# Patient Record
Sex: Male | Born: 1945 | Race: Black or African American | Hispanic: No | Marital: Married | Smoking: Never smoker
Health system: Southern US, Community
[De-identification: ages and names within clinical notes are randomized; demographics above are authoritative.]

## PROBLEM LIST (undated history)

## (undated) DIAGNOSIS — Z8249 Family history of ischemic heart disease and other diseases of the circulatory system: Secondary | ICD-10-CM

## (undated) DIAGNOSIS — C61 Malignant neoplasm of prostate: Secondary | ICD-10-CM

## (undated) DIAGNOSIS — R7303 Prediabetes: Secondary | ICD-10-CM

## (undated) DIAGNOSIS — R42 Dizziness and giddiness: Secondary | ICD-10-CM

## (undated) HISTORY — DX: Malignant neoplasm of prostate: C61

## (undated) HISTORY — PX: PROSTATE SURGERY: SHX751

## (undated) HISTORY — DX: Prediabetes: R73.03

## (undated) HISTORY — DX: Family history of ischemic heart disease and other diseases of the circulatory system: Z82.49

## (undated) HISTORY — DX: Dizziness and giddiness: R42

---

## 2001-08-23 ENCOUNTER — Ambulatory Visit (HOSPITAL_COMMUNITY): Admission: RE | Admit: 2001-08-23 | Discharge: 2001-08-23 | Payer: Self-pay | Admitting: Pediatrics

## 2001-08-23 ENCOUNTER — Encounter: Payer: Self-pay | Admitting: Pediatrics

## 2001-11-26 ENCOUNTER — Ambulatory Visit (HOSPITAL_COMMUNITY): Admission: RE | Admit: 2001-11-26 | Discharge: 2001-11-26 | Payer: Self-pay | Admitting: Internal Medicine

## 2007-06-28 ENCOUNTER — Ambulatory Visit: Payer: Self-pay | Admitting: Orthopedic Surgery

## 2008-12-21 ENCOUNTER — Ambulatory Visit: Payer: Self-pay | Admitting: Internal Medicine

## 2009-01-01 ENCOUNTER — Ambulatory Visit: Payer: Self-pay

## 2009-01-01 ENCOUNTER — Encounter: Payer: Self-pay | Admitting: Internal Medicine

## 2009-01-16 DIAGNOSIS — R0602 Shortness of breath: Secondary | ICD-10-CM | POA: Insufficient documentation

## 2009-01-22 ENCOUNTER — Encounter: Payer: Self-pay | Admitting: Internal Medicine

## 2009-01-22 ENCOUNTER — Ambulatory Visit: Payer: Self-pay | Admitting: Internal Medicine

## 2009-04-07 ENCOUNTER — Emergency Department (HOSPITAL_COMMUNITY): Admission: EM | Admit: 2009-04-07 | Discharge: 2009-04-07 | Payer: Self-pay | Admitting: Emergency Medicine

## 2009-10-02 ENCOUNTER — Ambulatory Visit: Payer: Self-pay | Admitting: Orthopedic Surgery

## 2009-10-02 DIAGNOSIS — IMO0002 Reserved for concepts with insufficient information to code with codable children: Secondary | ICD-10-CM | POA: Insufficient documentation

## 2009-10-02 DIAGNOSIS — M1710 Unilateral primary osteoarthritis, unspecified knee: Secondary | ICD-10-CM

## 2009-10-04 ENCOUNTER — Encounter: Payer: Self-pay | Admitting: Orthopedic Surgery

## 2009-11-24 DIAGNOSIS — C61 Malignant neoplasm of prostate: Secondary | ICD-10-CM

## 2009-11-24 HISTORY — DX: Malignant neoplasm of prostate: C61

## 2010-05-01 ENCOUNTER — Ambulatory Visit (HOSPITAL_COMMUNITY): Admission: RE | Admit: 2010-05-01 | Discharge: 2010-05-01 | Payer: Self-pay

## 2010-08-06 ENCOUNTER — Telehealth (INDEPENDENT_AMBULATORY_CARE_PROVIDER_SITE_OTHER): Payer: Self-pay | Admitting: *Deleted

## 2010-12-26 NOTE — Progress Notes (Signed)
  Phone Note From Other Clinic   Caller: An[facility] Initial call taken by: Dierdre Forth over to [phone] [facility]  August 06, 2010 10:04 AM

## 2011-03-04 LAB — DIFFERENTIAL
Basophils Absolute: 0.1 10*3/uL (ref 0.0–0.1)
Basophils Relative: 1 % (ref 0–1)
Lymphocytes Relative: 17 % (ref 12–46)
Monocytes Relative: 7 % (ref 3–12)
Neutro Abs: 4.3 10*3/uL (ref 1.7–7.7)
Neutrophils Relative %: 74 % (ref 43–77)

## 2011-03-04 LAB — COMPREHENSIVE METABOLIC PANEL
ALT: 42 U/L (ref 0–53)
AST: 45 U/L — ABNORMAL HIGH (ref 0–37)
Albumin: 4 g/dL (ref 3.5–5.2)
Alkaline Phosphatase: 82 U/L (ref 39–117)
BUN: 14 mg/dL (ref 6–23)
CO2: 30 mEq/L (ref 19–32)
Calcium: 9.1 mg/dL (ref 8.4–10.5)
Chloride: 103 mEq/L (ref 96–112)
Creatinine, Ser: 0.96 mg/dL (ref 0.4–1.5)
GFR calc Af Amer: >60 mL/min (ref >60)
GFR calc non Af Amer: >60 mL/min (ref >60)
Glucose, Bld: 120 mg/dL — ABNORMAL HIGH (ref 70–99)
Potassium: 3.7 mEq/L (ref 3.5–5.1)
Sodium: 141 mEq/L (ref 135–145)
Total Bilirubin: 0.9 mg/dL (ref 0.3–1.2)
Total Protein: 7.5 g/dL (ref 6.0–8.3)

## 2011-03-04 LAB — URINALYSIS, ROUTINE W REFLEX MICROSCOPIC
Glucose, UA: NEGATIVE mg/dL
Protein, ur: NEGATIVE mg/dL
Specific Gravity, Urine: 1.019 (ref 1.005–1.030)
Urobilinogen, UA: 1 mg/dL (ref 0.0–1.0)

## 2011-03-04 LAB — CBC
HCT: 40.5 % (ref 39.0–52.0)
Hemoglobin: 13.8 g/dL (ref 13.0–17.0)
MCHC: 34 g/dL (ref 30.0–36.0)
MCV: 98.6 fL (ref 78.0–100.0)
Platelets: 238 10*3/uL (ref 150–400)
RBC: 4.11 MIL/uL — ABNORMAL LOW (ref 4.22–5.81)
RDW: 16.4 % — ABNORMAL HIGH (ref 11.5–15.5)
WBC: 5.8 10*3/uL (ref 4.0–10.5)

## 2011-04-08 NOTE — Letter (Signed)
December 21, 2008    Stanley Bass. Stanley Cage DO, FAAP  [facility]  [address]  [place], [place] [address]   RE:  Stanley, Bass  MRN:  [id number]  /  DOB:  07-Nov-1946   Dear Dr. Milford Bass,   It was my pleasure to see your patient Stanley Bass in Cardiology  consultation today regarding presyncopal symptoms.  As you recall, [name] is a pleasant 65 year old gentleman with no significant medical  problems.  He notes that he was in good health until October 2009, when  he began having symptoms of lightheadedness and presyncope.  Since that  time, he has had 2-3 episodes per week, which he describes as abrupt  onset and termination of shortness of breath with subjective nasal  congestion, dizziness, unsteadiness, and presyncope.  He estimates that  these episodes typically last for only 30 seconds before resolving.  He  has not been able to identify any triggers or precipitance for these  episodes.  He notes that when they occur, he typically either sit down  or remain in the stable position until the symptoms abate.  Episodes do  not occur initially upon standing, but always occur after he has been  standing for several minutes.  He denies any episodes while sitting or  otherwise at rest.  He denies vertiginous complaints or syncope.  He  remains otherwise quite active.  He exercises on a treadmill 2-3 per  week without chest pain, shortness of breath, palpitations, presyncope,  or syncope.  During episodes, he denies chest pain, palpitations,  dysarthria, slurred speech, blurred vision, numbness, weakness,  tingling, or other neurologic symptoms.  He was recently initiated on  meclizine without significant improvement in symptoms.  He therefore  presents today for Cardiology consultation.   PAST MEDICAL HISTORY:  1. Degenerative joint disease.  2. Status post right knee arthroscopy in 2001.  3. Left finger surgery in 1994.   ALLERGIES:  PENICILLIN.   CURRENT MEDICATIONS:  1. Aspirin 81 mg daily.  2. Multivitamin with iron daily.  3. Meclizine 25 mg p.r.n.   SOCIAL HISTORY:  [place]  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  He denies tobacco, alcohol, or  drug use.   FAMILY HISTORY:  The patient's mother died of a brain aneurysm at age  57.  His father had hypertension and died of a stroke at age 49.  The  patient has a brother who died at age 62 of sudden death and previously  had heart racing.  The patient has a nephew who died at age 8  suddenly.  He has a sister and niece who have heart problems.  He also  has a brother who had a stroke previously   REVIEW OF SYSTEMS:  All systems are reviewed and negative except as  outlined in the HPI above and as documented below.  He reports  intermittent left shoulder pain, worse with sudden movements.   PHYSICAL EXAMINATION:  VITALS:  Blood pressure lying 137/88 with a heart  rate of 66, sitting 145/85 and heart rate 73, standing 145/99 and heart  rate 77, respirations 18, and weight 206 pounds.  GENERAL:  The patient is a well-appearing male in no acute distress.  He  is alert and oriented x3.  HEENT:  Normocephalic, atraumatic.  Sclerae clear.  Conjunctivae pink.  Oropharynx clear.  NECK:  Supple.  No JVD, thyromegaly, or bruits.  JVP  is 7-cm.  LUNGS:  Clear to auscultation bilaterally.  HEART:  Regular rate and rhythm.  No murmurs, rubs, or gallops.  GI:  Soft, nontender, and nondistended.  Positive bowel sounds.  EXTREMITIES:  No clubbing, cyanosis, or edema.  NEUROLOGIC:  Cranial nerves II through XII are intact.  Strength and  sensation are intact.  SKIN:  No ecchymosis or lacerations.  MUSCULOSKELETAL:  No deformity or atrophy.  PSYCHIATRIC:  Euthymic mood.  Full affect.   EKG today reveals sinus rhythm at 66 beats per minute.  A shortened PR  interval was observed without obvious pre-excitation.  The  QT interval  measures  420 msec.  There are no other significant abnormalities.   IMPRESSION:  Mr. Stanley Bass is a pleasant 65 year old gentleman without  significant past medical issues, who now presents for further evaluation  and management of abrupt episodic shortness of breath and presyncope.  The etiology of his symptoms are not clear at this time.  I think that  it is reasonable to proceed with a cardiac evaluation including an event  monitor, transthoracic echocardiogram, and GXT Myoview.  We will obtain  these studies over the next 2 weeks and then have the patient follow up  with me in clinic in 4 weeks to discuss the results and then proceed  with further management based on the findings.  I have encouraged the  patient to maintain adequate hydration and to avoid driving if any  syncopal episodes occur.   PLAN:  1. A 30-day event monitor.  2. Transthoracic echocardiogram.  3. GXT Myoview.  4. Follow up with me in 4 weeks.   Thank you for the opportunity of participating in the care of [name].  Please feel free to contact me, should further problems arise.    Sincerely,      Stanley Range, MD  Electronically Signed    JA/MedQ  DD: 12/21/2008  DT: 12/22/2008  Job #: [id number]

## 2011-04-11 NOTE — Op Note (Signed)
[facility]  Patient:    Stanley Bass, Stanley Bass Visit Number: [id number] MRN: [id number]          Service Type: END Location: DAY Attending Physician:  Jonathon Bellows Dictated by:   Roetta Sessions, M.D. Proc. Date: 11/26/01 Admit Date:  11/26/2001   CC:         Vivia Ewing, D.O.   Operative Report  PROCEDURE:  Diagnostic colonoscopy.  ENDOSCOPIST:  Roetta Sessions, M.D.  INDICATION FOR PROCEDURE:  Patient is a 65 year old gentleman followed primarily by Dr. Vivia Ewing, referred for hematochezia.  Mr. Cline had a single episode of hematochezia over the summer of 2002, has not had any blood per rectum since that time, denies constipation or diarrhea, has a least one formed bowel movement daily, no family history of colorectal neoplasia.  He has not had his lower GI tract imaged previously.  Colonoscopy is now being done.  This approach has been discussed with Mr. Kenley.  Potential risks, benefits and alternatives have been reviewed and questions answered; he is agreeable.  Please see my dictated consultation note of September 23, 2001 for more information; there has been no interim health problems.  INTERIM EXAMINATION:  SKIN:  Warm and dry.  CHEST:  Lungs are clear to auscultation.  CARDIAC:  Regular rate and rhythm without murmur, gallop or rub.  ABDOMEN:  Nondistended.  Positive bowel sounds.  Soft and nontender.  No appreciable mass or organomegaly.  DESCRIPTION OF PROCEDURE:  O2 saturation, blood pressure, pulse and respirations were monitored throughout the entire procedure.  Conscious sedation:  Versed 3 mg IV, Demerol 50 mg IV in divided doses.  INSTRUMENT:  Olympus video chip colonoscopy.  FINDINGS:  Digital rectal examination revealed no abnormalities.  ENDOSCOPIC FINDINGS:  Prep was good.  Examination of the rectal mucosa including a retroflexed view of anal verge and en face view of the anal canal demonstrated only minimal friable anal  canal hemorrhoids.  Colon:  Colonic mucosa was surveyed from the rectosigmoid junction through the left, transverse and right colon to the area of the appendiceal orifice, ileocecal valve and cecum.  The colonic mucosa appeared entirely normal all the way to the cecum.  Ileocecal valve, cecum and appendiceal orifice were photographed for the record.  The terminal ileum was intubated to 10 cm.  This segment of the GI tract also appeared normal.  From the level of the cecum and ileocecal valve, the scope was slowly withdrawn.  All previously mentioned mucosa surfaces were again seen and again, no other abnormalities were observed.  The patient tolerated the procedure well and was reactive at endoscopy.  IMPRESSION: 1. Minimal friable anal canal hemorrhoids, otherwise, normal rectum. 2. Normal colon. 3. Normal terminal ileum.  I suspect the patient had trivial benign anorectal bleeding.  He has been asymptomatic for over six months.  RECOMMENDATIONS: 1. Maintain adequate fiber intake in diet. 2. Hemorrhoid literature given to Mr. Vanderveer. 3. Patient should follow up with Dr. Vivia Ewing as needed. 4. He should consider having another colonoscopy in 10 years. Dictated by:   Roetta Sessions, M.D. Attending Physician:  Jonathon Bellows DD:  11/26/01 TD:  11/26/01 Job: [id number]

## 2012-03-09 DIAGNOSIS — N529 Male erectile dysfunction, unspecified: Secondary | ICD-10-CM | POA: Insufficient documentation

## 2012-03-09 DIAGNOSIS — N393 Stress incontinence (female) (male): Secondary | ICD-10-CM | POA: Insufficient documentation

## 2012-05-26 ENCOUNTER — Other Ambulatory Visit: Payer: Self-pay | Admitting: *Deleted

## 2012-08-24 ENCOUNTER — Ambulatory Visit (HOSPITAL_COMMUNITY): Payer: Self-pay | Admitting: Specialist

## 2013-12-28 DIAGNOSIS — H8120 Vestibular neuronitis, unspecified ear: Secondary | ICD-10-CM | POA: Diagnosis not present

## 2014-03-21 DIAGNOSIS — C61 Malignant neoplasm of prostate: Secondary | ICD-10-CM | POA: Diagnosis not present

## 2014-03-21 DIAGNOSIS — Z8546 Personal history of malignant neoplasm of prostate: Secondary | ICD-10-CM | POA: Diagnosis not present

## 2014-03-21 DIAGNOSIS — N529 Male erectile dysfunction, unspecified: Secondary | ICD-10-CM | POA: Diagnosis not present

## 2014-03-25 DIAGNOSIS — Z9079 Acquired absence of other genital organ(s): Secondary | ICD-10-CM | POA: Insufficient documentation

## 2014-09-12 DIAGNOSIS — R252 Cramp and spasm: Secondary | ICD-10-CM | POA: Diagnosis not present

## 2014-09-15 ENCOUNTER — Encounter: Payer: Self-pay | Admitting: Diagnostic Neuroimaging

## 2014-09-15 ENCOUNTER — Ambulatory Visit (INDEPENDENT_AMBULATORY_CARE_PROVIDER_SITE_OTHER): Payer: Medicare Other | Admitting: Diagnostic Neuroimaging

## 2014-09-15 VITALS — BP 144/78 | HR 64 | Ht 66.0 in | Wt 197.2 lb

## 2014-09-15 DIAGNOSIS — M79606 Pain in leg, unspecified: Secondary | ICD-10-CM

## 2014-09-15 DIAGNOSIS — G4762 Sleep related leg cramps: Secondary | ICD-10-CM

## 2014-09-15 DIAGNOSIS — R292 Abnormal reflex: Secondary | ICD-10-CM

## 2014-09-15 DIAGNOSIS — C61 Malignant neoplasm of prostate: Secondary | ICD-10-CM | POA: Insufficient documentation

## 2014-09-15 DIAGNOSIS — Q670 Congenital facial asymmetry: Secondary | ICD-10-CM

## 2014-09-15 DIAGNOSIS — R42 Dizziness and giddiness: Secondary | ICD-10-CM | POA: Diagnosis not present

## 2014-09-15 DIAGNOSIS — M79609 Pain in unspecified limb: Secondary | ICD-10-CM | POA: Diagnosis not present

## 2014-09-15 NOTE — Progress Notes (Signed)
[facility]  PATIENT: Stanley Bass DOB: 1946-01-14  REFERRING CLINICIAN: Guarino HISTORY FROM: patient  REASON FOR VISIT: new consult    HISTORICAL  CHIEF COMPLAINT:  Chief Complaint  Patient presents with  . Pain    leg    HISTORY OF PRESENT ILLNESS:   NEW HPI (09/15/14): 68 year old right-handed male with prostate cancer, status post prostatectomy, here for evaluation of lower extremity nocturnal cramps and vertigo. I previously saw the patient for vertigo evaluation in 2011, and at that time he was diagnosed with vitamin B12 deficiency (level 123). Now patient reports one-year history of cramps in his bilateral thighs, especially when he lays down at night time. Patient has tried eating mustard, eating pickles, increasing water intake, placing hot towel on his legs, with mild relief. Patient feels tightening and spasm in his bilateral thighs. Occasionally has numbness in his toes and feet. No symptoms in his hands. Paragraph patient continues to have intermittent spinning sensation vertigo. Previous MRI of the brain with IAC protocol, and MRA of the neck were unremarkable in 2011.  PRIOR HPI (05/23/10, VRP): 68 year old right-handed male with no significant past medical history presented for evaluation of intermittent vertigo, tinnitus and blurred vision. In April 2011 patient was getting ready to go to sleep. When he laid down on his bed he had a sudden onset of room spinning sensation. He felt as though "he was going to die".   He shut his eyes tight, tried to sit up was eventually able to walk to the kitchen. He drank a glass of water mixed with vinegar (because he thought his blood pressure was high, and that this would reduce it).  The room spinning sensation continued. He went back to his room, laid down and went to sleep.  The next morning she woke up and he did not have room spinning sensation. However whenever she turned his head to the right, this would  reproduce mild room spinning sensation. He denies nausea, vomiting, slurred speech, double vision, numbness or weakness with these episodes. He was evaluated by primary care and ENT.  No specific etiology was found.  Approximately 2 weeks ago his intermittent vertigo stopped. Nowadays he has an abnormal sensation in his ears like he is "up in the mountains".  He describes ringing in the ears and a "roaring" sound.  This sensation is fairly constant.  In addition he has mild blurred vision and right neck stiffness/pain. Separately patient has noticed that he can't feel the hot air from a blow dryer on his legs.  This has gradually developed over the past year.  REVIEW OF SYSTEMS: Full 14 system review of systems performed and notable only for ringing in ears itching cramps.  ALLERGIES: Allergies  Allergen Reactions  . Penicillins     HOME MEDICATIONS: No outpatient prescriptions prior to visit.   No facility-administered medications prior to visit.    PAST MEDICAL HISTORY: Past Medical History  Diagnosis Date  . Vertigo   . Prostate cancer     PAST SURGICAL HISTORY: Past Surgical History  Procedure Laterality Date  . Prostate surgery      FAMILY HISTORY: Family History  Problem Relation Age of Onset  . Diabetes Mother   . Stroke Father     SOCIAL HISTORY:  History   Social History  . Marital Status: Married    Spouse Name: Blanch Media    Number of Children: 3  . Years of Education: HS   Occupational History  . Retired  Other   Social History Main Topics  . Smoking status: Never Smoker   . Smokeless tobacco: Never Used  . Alcohol Use: No  . Drug Use: No  . Sexual Activity: Not on file   Other Topics Concern  . Not on file   Social History Narrative   Patient lives at home with his spouse.   Caffeine Use: 1-2 cups daily     PHYSICAL EXAM  Filed Vitals:   09/15/14 0840  BP: 144/78  Pulse: 64  Height: _0  (1.676 m)  Weight: 197 lb 3.2 oz (89.449 kg)     Not recorded    Visual Acuity Screening   Right eye Left eye Both eyes  Without correction:     With correction: 20/30 20/50      Body mass index is 31.84 kg/(m^2).  GENERAL EXAM: Patient is in no distress; well developed, nourished and groomed; neck is supple  CARDIOVASCULAR: Regular rate and rhythm, no murmurs, no carotid bruits  NEUROLOGIC: MENTAL STATUS: awake, alert, oriented to person, place and time, recent and remote memory intact, normal attention and concentration, language fluent, comprehension intact, naming intact, fund of knowledge appropriate CRANIAL NERVE: no papilledema on fundoscopic exam, pupils equal and reactive to light, visual fields full to confrontation, extraocular muscles intact, no nystagmus, facial sensation symmetric, MILD LEFT PTOSIS, MILD DECR LEFT NL FOLD, hearing intact, palate elevates symmetrically, uvula midline, shoulder shrug symmetric, tongue midline. MOTOR: normal bulk and tone, full strength in the BUE, BLE SENSORY: normal and symmetric to light touch, pinprick, temperature, vibration   COORDINATION: finger-nose-finger, fine finger movements, heel-shin normal REFLEXES: deep tendon reflexes BRISK IN BUE AND KNEES; ABSENT AT ANKLES GAIT/STATION: narrow based gait; able to walk tandem; romberg is negative    DIAGNOSTIC DATA (LABS, IMAGING, TESTING) - I reviewed patient records, labs, notes, testing and imaging myself where available.  Lab Results  Component Value Date   WBC 5.8 04/07/2009   HGB 13.8 04/07/2009   HCT 40.5 04/07/2009   MCV 98.6 04/07/2009   PLT 238 04/07/2009      Component Value Date/Time   NA 141 04/07/2009 1015   K 3.7 04/07/2009 1015   CL 103 04/07/2009 1015   CO2 30 04/07/2009 1015   GLUCOSE 120* 04/07/2009 1015   BUN 14 04/07/2009 1015   CREATININE 0.96 04/07/2009 1015   CALCIUM 9.1 04/07/2009 1015   PROT 7.5 04/07/2009 1015   ALBUMIN 4.0 04/07/2009 1015   AST 45* 04/07/2009 1015   ALT 42 04/07/2009 1015   ALKPHOS 82  04/07/2009 1015   BILITOT 0.9 04/07/2009 1015   GFRNONAA >60 04/07/2009 1015   GFRAA  Value: >60        The eGFR has been calculated using the MDRD equation. This calculation has not been validated in all clinical situations. eGFR's persistently <60 mL/min signify possible Chronic Kidney Disease. 04/07/2009 1015   No results found for this basename: CHOL, HDL, LDLCALC, LDLDIRECT, TRIG, CHOLHDL   No results found for this basename: HGBA1C   No results found for this basename: VITAMINB12   No results found for this basename: TSH    05/23/10 LABS - a1c 5.3, TSH 1.280, B12 123 (L)  06/12/10 MRI BRAIN AND IAC - normal  06/12/10 MRA NECK - normal   ASSESSMENT AND PLAN  68 y.o. year old male here with 1 year of nocturnal leg cramps. Also with intermittent vertigo and left facial weakness. History of B12 deficiency. Will check further workup.  Ddx of cramps: restless leg syndrome, neuropathy, metabolic  Ddx of vertigo/facial weakness: CN7/8 lesion, CNS inflamm/autoimmune, vascular, structural   PLAN:  Orders Placed This Encounter  Procedures  . MR Brain/IAC Wo/W Cm  . CBC With differential/Platelet  . Comprehensive metabolic panel  . Magnesium  . Ferritin  . Iron and TIBC  . Vitamin B12  . TSH  . Hemoglobin A1c    Return in about 1 month (around 10/16/2014).    Penni Bombard, MD 41/99/1444, 5:84 AM Certified in Neurology, Neurophysiology and Neuroimaging  [facility] [address] [place], [place] [address] [phone]

## 2014-09-15 NOTE — Patient Instructions (Signed)
UpToDate Official reprint from UpToDate  [email or web address] 2015 UpToDate [employer]  Back  Patient information: Nocturnal (nighttime) leg cramps (The Basics) Written by the doctors and editors at UpToDate  What are nocturnal (nighttime) leg cramps? - Nighttime leg cramps cause pain and sudden muscle tightness in the legs, feet, or both. The cramps can wake you up from sleep. They can last for many minutes or just a few seconds.  Nighttime leg cramps are common in both adults and children. But as people get older, they are more likely to get them. About half of people older than 50 get nighttime leg cramps.  What causes nighttime leg cramps? - Most nighttime leg cramps do not have a cause that doctors can find. When doctors do find causes, the causes can include:  ?Having a leg or foot structure that is different from normal - For example, having flat feet or a knee that bends in the wrong direction ?Sitting in an awkward position or sitting too long in one position ?Standing or walking a lot on concrete floors ?Changes in your body's fluid balance - This can happen if you: .Take medicines called diuretics (also called "water pills") .Are on dialysis (a kind of treatment for kidney disease) .Sweat too much ?Exercising ?Having certain conditions - For example, Parkinson disease, diabetes, or low thyroid ?Being pregnant - Some pregnant women do not have enough of the mineral magnesium in their blood. This can cause leg cramps. ?Taking certain medicines Is there anything I can do on my own to feel better? - Yes. Things you can try include:  ?Riding a stationary bike for a few minutes before bed - If you normally get little exercise, this might help. ?Doing stretching exercises (picture 1) ?Wearing shoes with firm support, especially at the back of your foot around your heel ?Keeping bed covers loose at the foot of your bed and NOT tucked in ?Drinking plenty of water,  especially if you take diuretics. (Do this only if your doctor or nurse has not told you to limit the amount of water you drink.) ?Limiting the amount of alcohol and caffeine you drink ?Staying cool when you exercise, and NOT exercising in very hot weather or hot rooms If you get a cramp, slowly stretch the cramped muscle. To prevent more cramps, you can try:  ?Walking around or jiggling your leg or foot ?Lying down with your legs and feet up ?Taking a hot shower with water spraying on the cramp for 5 minutes, or taking a warm bath ?Rubbing the cramp with ice wrapped in a towel Should I see a doctor or nurse? - See a doctor or nurse if:  ?You wake up several times a night with leg cramps ?Your cramps keep you from getting enough sleep ?Your cramps are very painful ?You have cramps in other parts of your body, such as your upper back or belly Are there tests I should have? - Probably not. Your doctor or nurse will talk with you about your symptoms and do an exam to find out what could be causing your nighttime leg cramps. Depending on your symptoms and exam, you might also need some blood tests.  How are nighttime leg cramps treated? - Treatment is different for everyone. Most people have to try a few different things before they find a treatment that helps them.  Treatment options include:  ?Making lifestyle changes - For example, exercising differently, doing stretching exercises, wearing shoes with good support, or drinking enough fluids ?  Taking supplements - Supplements are pills, capsules, liquids, or tablets with minerals or vitamins your body needs. Tell your doctor or nurse about any minerals, vitamins, or herbal medicines you already take. ?Stopping any medicines you take that could cause cramps. But do not stop taking any medicine unless your doctor or nurse says it is OK. ?Medicines - Taking prescription medicines that improve sleep, relax muscles, calm overactive nerves, or help in  other ways. Doctors and nurses prescribe medicines for nocturnal leg cramps only when other types of treatment do not work. What if my child gets nocturnal leg cramps? - Nocturnal leg cramps are common in children. Talk to your child's doctor or nurse if your child:  ?Has leg cramps often ?Cannot sleep well because of leg cramps  Nocturnal leg cramps can run in families. Tell your doctor or nurse if someone else in your family also has nocturnal leg cramps.

## 2014-09-16 LAB — COMPREHENSIVE METABOLIC PANEL
ALBUMIN: 4.4 g/dL (ref 3.6–4.8)
ALK PHOS: 85 IU/L (ref 39–117)
ALT: 29 IU/L (ref 0–44)
AST: 27 IU/L (ref 0–40)
Albumin/Globulin Ratio: 1.4 (ref 1.1–2.5)
BUN/Creatinine Ratio: 12 (ref 10–22)
BUN: 13 mg/dL (ref 8–27)
CHLORIDE: 97 mmol/L (ref 97–108)
CO2: 26 mmol/L (ref 18–29)
Calcium: 9.5 mg/dL (ref 8.6–10.2)
Creatinine, Ser: 1.07 mg/dL (ref 0.76–1.27)
GFR calc Af Amer: 83 mL/min/{1.73_m2} (ref >59)
GFR calc non Af Amer: 72 mL/min/{1.73_m2} (ref >59)
GLUCOSE: 89 mg/dL (ref 65–99)
Globulin, Total: 3.2 g/dL (ref 1.5–4.5)
Potassium: 4.5 mmol/L (ref 3.5–5.2)
Sodium: 139 mmol/L (ref 134–144)
TOTAL PROTEIN: 7.6 g/dL (ref 6.0–8.5)
Total Bilirubin: 0.4 mg/dL (ref 0.0–1.2)

## 2014-09-16 LAB — CBC WITH DIFFERENTIAL
BASOS ABS: 0 10*3/uL (ref 0.0–0.2)
Basos: 0 %
Eos: 4 %
Eosinophils Absolute: 0.2 10*3/uL (ref 0.0–0.4)
HEMATOCRIT: 44.9 % (ref 37.5–51.0)
HEMOGLOBIN: 14.4 g/dL (ref 12.6–17.7)
IMMATURE GRANS (ABS): 0 10*3/uL (ref 0.0–0.1)
IMMATURE GRANULOCYTES: 0 %
LYMPHS: 36 %
Lymphocytes Absolute: 1.8 10*3/uL (ref 0.7–3.1)
MCH: 26.8 pg (ref 26.6–33.0)
MCHC: 32.1 g/dL (ref 31.5–35.7)
MCV: 84 fL (ref 79–97)
MONOCYTES: 9 %
Monocytes Absolute: 0.4 10*3/uL (ref 0.1–0.9)
NEUTROS PCT: 51 %
Neutrophils Absolute: 2.6 10*3/uL (ref 1.4–7.0)
Platelets: 257 10*3/uL (ref 150–379)
RBC: 5.38 x10E6/uL (ref 4.14–5.80)
RDW: 16.2 % — ABNORMAL HIGH (ref 12.3–15.4)
WBC: 5.1 10*3/uL (ref 3.4–10.8)

## 2014-09-16 LAB — HEMOGLOBIN A1C
Est. average glucose Bld gHb Est-mCnc: 140 mg/dL
HEMOGLOBIN A1C: 6.5 % — AB (ref 4.8–5.6)

## 2014-09-16 LAB — IRON AND TIBC
IRON: 50 ug/dL (ref 40–155)
Iron Saturation: 12 % — ABNORMAL LOW (ref 15–55)
TIBC: 413 ug/dL (ref 250–450)
UIBC: 363 ug/dL (ref 150–375)

## 2014-09-16 LAB — TSH: TSH: 1.32 u[IU]/mL (ref 0.450–4.500)

## 2014-09-16 LAB — MAGNESIUM: MAGNESIUM: 2.2 mg/dL (ref 1.6–2.6)

## 2014-09-16 LAB — VITAMIN B12: Vitamin B-12: 153 pg/mL — ABNORMAL LOW (ref 211–946)

## 2014-09-16 LAB — FERRITIN: FERRITIN: 18 ng/mL — AB (ref 30–400)

## 2014-09-18 ENCOUNTER — Ambulatory Visit: Payer: Medicare Other | Admitting: Diagnostic Neuroimaging

## 2014-09-29 ENCOUNTER — Telehealth: Payer: Self-pay | Admitting: Diagnostic Neuroimaging

## 2014-09-29 DIAGNOSIS — E1169 Type 2 diabetes mellitus with other specified complication: Secondary | ICD-10-CM | POA: Diagnosis not present

## 2014-09-29 DIAGNOSIS — Z23 Encounter for immunization: Secondary | ICD-10-CM | POA: Diagnosis not present

## 2014-09-29 DIAGNOSIS — E782 Mixed hyperlipidemia: Secondary | ICD-10-CM | POA: Diagnosis not present

## 2014-09-29 DIAGNOSIS — R972 Elevated prostate specific antigen [PSA]: Secondary | ICD-10-CM | POA: Diagnosis not present

## 2014-09-29 DIAGNOSIS — I10 Essential (primary) hypertension: Secondary | ICD-10-CM | POA: Diagnosis not present

## 2014-09-29 NOTE — Telephone Encounter (Signed)
I called pt and left message.  B12 is low (153). Needs replacement shots through our office or PCP. A1c 6.5 needs PCP follow up. Also bordering iron low, needs follow up and daily multi-vitamin to start. -VRP

## 2014-10-02 NOTE — Telephone Encounter (Signed)
Called patient.  No answer.

## 2014-10-02 NOTE — Telephone Encounter (Signed)
Patient returning call to [facility], please return call and advise.

## 2014-10-03 NOTE — Telephone Encounter (Signed)
Spoke to patient. He received Dr. Gladstone Lighter message. Patient verbalized understanding.

## 2014-10-11 ENCOUNTER — Ambulatory Visit
Admission: RE | Admit: 2014-10-11 | Discharge: 2014-10-11 | Disposition: A | Discharge status: Home / Self Care | Payer: Medicare Other | Source: Ambulatory Visit | Attending: Diagnostic Neuroimaging | Admitting: Diagnostic Neuroimaging

## 2014-10-11 DIAGNOSIS — R42 Dizziness and giddiness: Secondary | ICD-10-CM

## 2014-10-11 DIAGNOSIS — R292 Abnormal reflex: Secondary | ICD-10-CM | POA: Diagnosis not present

## 2014-10-11 DIAGNOSIS — Q670 Congenital facial asymmetry: Secondary | ICD-10-CM

## 2014-10-11 MED ORDER — GADOBENATE DIMEGLUMINE 529 MG/ML IV SOLN
18.0000 mL | Freq: Once | INTRAVENOUS | Status: AC | PRN
Start: 2014-10-11 — End: 2014-10-11
  Administered 2014-10-11: 18 mL via INTRAVENOUS

## 2014-10-16 ENCOUNTER — Encounter: Payer: Self-pay | Admitting: Diagnostic Neuroimaging

## 2014-10-16 ENCOUNTER — Ambulatory Visit (INDEPENDENT_AMBULATORY_CARE_PROVIDER_SITE_OTHER): Payer: Medicare Other | Admitting: Diagnostic Neuroimaging

## 2014-10-16 VITALS — BP 120/73 | HR 78 | Ht 67.0 in | Wt 199.8 lb

## 2014-10-16 DIAGNOSIS — R42 Dizziness and giddiness: Secondary | ICD-10-CM | POA: Diagnosis not present

## 2014-10-16 DIAGNOSIS — E611 Iron deficiency: Secondary | ICD-10-CM | POA: Diagnosis not present

## 2014-10-16 DIAGNOSIS — G4762 Sleep related leg cramps: Secondary | ICD-10-CM | POA: Diagnosis not present

## 2014-10-16 DIAGNOSIS — E538 Deficiency of other specified B group vitamins: Secondary | ICD-10-CM

## 2014-10-16 NOTE — Progress Notes (Signed)
[facility]  PATIENT: Stanley Bass DOB: Jan 31, 1946  REFERRING CLINICIAN: Guarino HISTORY FROM: patient  REASON FOR VISIT: follow up   HISTORICAL  CHIEF COMPLAINT:  Chief Complaint  Patient presents with  . Follow-up    HISTORY OF PRESENT ILLNESS:   UPDATE 10/16/14: Since last visit, started OTC magnesium and cramps much improved. Planning to get B12 replacement shots via PCP. Vertigo also improved. No more events.  NEW HPI (09/15/14): 68 year old right-handed male with prostate cancer, status post prostatectomy, here for evaluation of lower extremity nocturnal cramps and vertigo. I previously saw the patient for vertigo evaluation in 2011, and at that time he was diagnosed with vitamin B12 deficiency (level 123). Now patient reports one-year history of cramps in his bilateral thighs, especially when he lays down at night time. Patient has tried eating mustard, eating pickles, increasing water intake, placing hot towel on his legs, with mild relief. Patient feels tightening and spasm in his bilateral thighs. Occasionally has numbness in his toes and feet. No symptoms in his hands. Paragraph patient continues to have intermittent spinning sensation vertigo. Previous MRI of the brain with IAC protocol, and MRA of the neck were unremarkable in 2011.  PRIOR HPI (05/23/10, VRP): 68 year old right-handed male with no significant past medical history presented for evaluation of intermittent vertigo, tinnitus and blurred vision. In April 2011 patient was getting ready to go to sleep. When he laid down on his bed he had a sudden onset of room spinning sensation. He felt as though "he was going to die".   He shut his eyes tight, tried to sit up was eventually able to walk to the kitchen. He drank a glass of water mixed with vinegar (because he thought his blood pressure was high, and that this would reduce it).  The room spinning sensation continued. He went back to his room,  laid down and went to sleep.  The next morning she woke up and he did not have room spinning sensation. However whenever she turned his head to the right, this would reproduce mild room spinning sensation. He denies nausea, vomiting, slurred speech, double vision, numbness or weakness with these episodes. He was evaluated by primary care and ENT.  No specific etiology was found.  Approximately 2 weeks ago his intermittent vertigo stopped. Nowadays he has an abnormal sensation in his ears like he is "up in the mountains".  He describes ringing in the ears and a "roaring" sound.  This sensation is fairly constant.  In addition he has mild blurred vision and right neck stiffness/pain. Separately patient has noticed that he can't feel the hot air from a blow dryer on his legs.  This has gradually developed over the past year.  REVIEW OF SYSTEMS: Full 14 system review of systems performed and notable only as per HPI.   ALLERGIES: Allergies  Allergen Reactions  . Penicillins     HOME MEDICATIONS: Outpatient Prescriptions Prior to Visit  Medication Sig Dispense Refill  . aspirin 81 MG tablet Take 81 mg by mouth daily.    . meclizine (ANTIVERT) 25 MG tablet Take 25 mg by mouth 3 (three) times daily as needed for dizziness.    . Multiple Vitamins-Minerals (CENTRUM SILVER PO) Take 1 tablet by mouth daily.    Marland Kitchen triamcinolone (KENALOG) 0.025 % cream Apply 1 application topically 2 (two) times daily.     No facility-administered medications prior to visit.    PAST MEDICAL HISTORY: Past Medical History  Diagnosis Date  .  Vertigo   . Prostate cancer     PAST SURGICAL HISTORY: Past Surgical History  Procedure Laterality Date  . Prostate surgery      FAMILY HISTORY: Family History  Problem Relation Age of Onset  . Diabetes Mother   . Stroke Father     SOCIAL HISTORY:  History   Social History  . Marital Status: Married    Spouse Name: Blanch Media    Number of Children: 3  . Years of  Education: HS   Occupational History  . Retired Other   Social History Main Topics  . Smoking status: Never Smoker   . Smokeless tobacco: Never Used  . Alcohol Use: No  . Drug Use: No  . Sexual Activity: Not on file   Other Topics Concern  . Not on file   Social History Narrative   Patient lives at home with his spouse.   Caffeine Use: 1-2 cups daily     PHYSICAL EXAM  Filed Vitals:   10/16/14 1506  BP: 120/73  Pulse: 78  Height: 5\' 7"  (1.702 m)  Weight: 199 lb 12.8 oz (90.629 kg)    Not recorded     No exam data present   Body mass index is 31.29 kg/(m^2).  GENERAL EXAM: Patient is in no distress; well developed, nourished and groomed; neck is supple  CARDIOVASCULAR: Regular rate and rhythm, no murmurs, no carotid bruits  NEUROLOGIC: MENTAL STATUS: awake, alert, language fluent, comprehension intact, naming intact, fund of knowledge appropriate CRANIAL NERVE: no papilledema on fundoscopic exam, pupils equal and reactive to light, visual fields full to confrontation, extraocular muscles intact, no nystagmus, facial sensation symmetric, MILD LEFT PTOSIS, MILD DECR LEFT NL FOLD, hearing intact, palate elevates symmetrically, uvula midline, shoulder shrug symmetric, tongue midline. MOTOR: normal bulk and tone, full strength in the BUE, BLE SENSORY: normal and symmetric to light touch, pinprick, temperature, vibration   COORDINATION: finger-nose-finger, fine finger movements, heel-shin normal REFLEXES: deep tendon reflexes BRISK IN BUE AND KNEES; ABSENT AT ANKLES GAIT/STATION: narrow based gait; able to walk tandem; romberg is negative    DIAGNOSTIC DATA (LABS, IMAGING, TESTING) - I reviewed patient records, labs, notes, testing and imaging myself where available.  Lab Results  Component Value Date   WBC 5.1 09/15/2014   HGB 14.4 09/15/2014   HCT 44.9 09/15/2014   MCV 84 09/15/2014   PLT 257 09/15/2014      Component Value Date/Time   NA 139 09/15/2014  0917   NA 141 04/07/2009 1015   K 4.5 09/15/2014 0917   CL 97 09/15/2014 0917   CO2 26 09/15/2014 0917   GLUCOSE 89 09/15/2014 0917   GLUCOSE 120* 04/07/2009 1015   BUN 13 09/15/2014 0917   BUN 14 04/07/2009 1015   CREATININE 1.07 09/15/2014 0917   CALCIUM 9.5 09/15/2014 0917   PROT 7.6 09/15/2014 0917   PROT 7.5 04/07/2009 1015   ALBUMIN 4.0 04/07/2009 1015   AST 27 09/15/2014 0917   ALT 29 09/15/2014 0917   ALKPHOS 85 09/15/2014 0917   BILITOT 0.4 09/15/2014 0917   GFRNONAA 72 09/15/2014 0917   GFRAA 83 09/15/2014 0917   No results found for: CHOL Lab Results  Component Value Date   HGBA1C 6.5* 09/15/2014   Lab Results  Component Value Date   VITAMINB12 153* 09/15/2014   Lab Results  Component Value Date   TSH 1.320 09/15/2014   MAGNESIUM  Date Value Ref Range Status  09/15/2014 2.2 1.6 - 2.6 mg/dL Final  Lab Results  Component Value Date   IRON 50 09/15/2014   TIBC 413 09/15/2014   FERRITIN 18* 09/15/2014    05/23/10 LABS - a1c 5.3, TSH 1.280, B12 123 (L)  06/12/10 MRI BRAIN AND IAC - normal  06/12/10 MRA NECK - normal  10/11/14 MRI brain and IAC - normal    ASSESSMENT AND PLAN  68 y.o. year old male here with 1 year of nocturnal leg cramps. Also with intermittent vertigo and left facial weakness.   Ddx of cramps: restless leg syndrome, neuropathy, metabolic (W29 and iron deficiency)  Ddx of vertigo/facial weakness: CN7/8 inflammation/dysfunction (but MRI negative)   PLAN: - continue B12 replacement - follow up iron levels and A1c with PCP  Return in about 6 months (around 04/16/2015).    Penni Bombard, MD 93/71/6967, 8:93 PM Certified in Neurology, Neurophysiology and Neuroimaging  [facility] [address], [place], [place] [address] [phone]

## 2014-10-16 NOTE — Patient Instructions (Signed)
Continue B12 injections.  Follow up iron levels with Dr. Berdine Addison.  Use magnesium for cramps.

## 2014-11-07 ENCOUNTER — Ambulatory Visit (INDEPENDENT_AMBULATORY_CARE_PROVIDER_SITE_OTHER): Payer: Medicare Other | Admitting: Orthopedic Surgery

## 2014-11-07 ENCOUNTER — Encounter: Payer: Self-pay | Admitting: Orthopedic Surgery

## 2014-11-07 ENCOUNTER — Ambulatory Visit (INDEPENDENT_AMBULATORY_CARE_PROVIDER_SITE_OTHER): Payer: Medicare Other

## 2014-11-07 VITALS — BP 117/76 | Ht 67.0 in | Wt 199.8 lb

## 2014-11-07 DIAGNOSIS — M25561 Pain in right knee: Secondary | ICD-10-CM

## 2014-11-07 DIAGNOSIS — M1711 Unilateral primary osteoarthritis, right knee: Secondary | ICD-10-CM

## 2014-11-07 DIAGNOSIS — M129 Arthropathy, unspecified: Secondary | ICD-10-CM

## 2014-11-07 NOTE — Patient Instructions (Signed)
Activities as tolerated. 

## 2014-11-07 NOTE — Progress Notes (Signed)
Patient ID: Stanley Bass, male   DOB: 22-Jun-1946, 68 y.o.   MRN: [id number]  Chief Complaint  Patient presents with  . Knee Pain    right knee pain and swelling x 3 months, no known injury   (SARK 2001)    HPI Stanley Bass is a 68 y.o. male.  HPI  Patient presents last seen in 2010 he had osteoarthritis of his knee. He was playing a lot of golf recently and noticed pain and swelling in his knee. He treated himself with over-the-counter medications and creams and now has no pain. Has no swelling. He is returned to his normal activities.  Review of Systems Review of Systems   Past Medical History  Diagnosis Date  . Vertigo   . Prostate cancer     Past Surgical History  Procedure Laterality Date  . Prostate surgery      Family History  Problem Relation Age of Onset  . Diabetes Mother   . Stroke Father     Social History History  Substance Use Topics  . Smoking status: Never Smoker   . Smokeless tobacco: Never Used  . Alcohol Use: No    Allergies  Allergen Reactions  . Penicillins     Current Outpatient Prescriptions  Medication Sig Dispense Refill  . aspirin 81 MG tablet Take 81 mg by mouth daily.    . Cyanocobalamin (VITAMIN B-12 CR PO) Take by mouth.    . magnesium 30 MG tablet Take 30 mg by mouth 2 (two) times daily.    . meclizine (ANTIVERT) 25 MG tablet Take 25 mg by mouth 3 (three) times daily as needed for dizziness.    . Multiple Vitamins-Minerals (CENTRUM SILVER PO) Take 1 tablet by mouth daily.    Marland Kitchen triamcinolone (KENALOG) 0.025 % cream Apply 1 application topically 2 (two) times daily.     No current facility-administered medications for this visit.       Physical Exam Blood pressure 117/76, height 5\' 7"  (1.702 m), weight 199 lb 12.8 oz (90.629 kg). Physical Exam He is awake alert and oriented 3 mood and affect are normal weight is well controlled ambulation is normal he has a moderate varus deformity in his knee probably best describes is  varus alignment. No tenderness no swelling range of motion is full ligaments are stable motor exams intact scans normal pulses are good sensation is normal and he has no lymphadenopathy  Data Reviewed Independent x-ray review Moderate arthritis right knee varus alignment Assessment    Stable osteoarthritis    Plan    Continue activities as tolerated follow-up as needed

## 2015-04-09 DIAGNOSIS — R05 Cough: Secondary | ICD-10-CM | POA: Diagnosis not present

## 2015-04-09 DIAGNOSIS — J309 Allergic rhinitis, unspecified: Secondary | ICD-10-CM | POA: Diagnosis not present

## 2015-04-16 ENCOUNTER — Ambulatory Visit: Payer: Medicare Other | Admitting: Diagnostic Neuroimaging

## 2015-05-11 DIAGNOSIS — Z6833 Body mass index (BMI) 33.0-33.9, adult: Secondary | ICD-10-CM | POA: Diagnosis not present

## 2015-05-11 DIAGNOSIS — R21 Rash and other nonspecific skin eruption: Secondary | ICD-10-CM | POA: Diagnosis not present

## 2015-07-02 ENCOUNTER — Telehealth: Payer: Self-pay | Admitting: Diagnostic Neuroimaging

## 2015-07-02 DIAGNOSIS — R42 Dizziness and giddiness: Secondary | ICD-10-CM

## 2015-07-02 NOTE — Telephone Encounter (Signed)
pls go ahead with referral. -VRP

## 2015-07-02 NOTE — Telephone Encounter (Signed)
Patient called requesting referral to be sent to [facility], Dr Lynelle Smoke  Fax # [phone] for vertigo. Patient can be reached at [phone] .

## 2015-07-03 NOTE — Telephone Encounter (Signed)
Left vm informing patient that referral to Dr Esmond Camper per Dr Leta Baptist.was successfully faxed today. Advised he call if he does not hear from office in a week. Left this caller's name, number for any questions.

## 2015-09-11 DIAGNOSIS — Z23 Encounter for immunization: Secondary | ICD-10-CM | POA: Diagnosis not present

## 2015-10-24 DIAGNOSIS — E782 Mixed hyperlipidemia: Secondary | ICD-10-CM | POA: Diagnosis not present

## 2015-10-24 DIAGNOSIS — H8110 Benign paroxysmal vertigo, unspecified ear: Secondary | ICD-10-CM | POA: Diagnosis not present

## 2015-10-24 DIAGNOSIS — Z6830 Body mass index (BMI) 30.0-30.9, adult: Secondary | ICD-10-CM | POA: Diagnosis not present

## 2015-10-24 DIAGNOSIS — H9202 Otalgia, left ear: Secondary | ICD-10-CM | POA: Diagnosis not present

## 2015-11-22 DIAGNOSIS — N4889 Other specified disorders of penis: Secondary | ICD-10-CM | POA: Insufficient documentation

## 2015-11-22 DIAGNOSIS — Z79899 Other long term (current) drug therapy: Secondary | ICD-10-CM | POA: Diagnosis not present

## 2015-11-22 DIAGNOSIS — C61 Malignant neoplasm of prostate: Secondary | ICD-10-CM | POA: Diagnosis not present

## 2015-11-22 DIAGNOSIS — N486 Induration penis plastica: Secondary | ICD-10-CM | POA: Diagnosis not present

## 2015-11-22 DIAGNOSIS — N529 Male erectile dysfunction, unspecified: Secondary | ICD-10-CM | POA: Diagnosis not present

## 2015-11-22 DIAGNOSIS — Z9079 Acquired absence of other genital organ(s): Secondary | ICD-10-CM | POA: Diagnosis not present

## 2015-11-29 DIAGNOSIS — J069 Acute upper respiratory infection, unspecified: Secondary | ICD-10-CM | POA: Diagnosis not present

## 2015-11-29 DIAGNOSIS — J40 Bronchitis, not specified as acute or chronic: Secondary | ICD-10-CM | POA: Diagnosis not present

## 2015-12-07 DIAGNOSIS — J40 Bronchitis, not specified as acute or chronic: Secondary | ICD-10-CM | POA: Diagnosis not present

## 2015-12-18 DIAGNOSIS — H2513 Age-related nuclear cataract, bilateral: Secondary | ICD-10-CM | POA: Diagnosis not present

## 2016-01-04 DIAGNOSIS — Z8546 Personal history of malignant neoplasm of prostate: Secondary | ICD-10-CM | POA: Diagnosis not present

## 2016-01-04 DIAGNOSIS — N486 Induration penis plastica: Secondary | ICD-10-CM | POA: Diagnosis not present

## 2016-01-04 DIAGNOSIS — N5231 Erectile dysfunction following radical prostatectomy: Secondary | ICD-10-CM | POA: Diagnosis not present

## 2016-02-05 DIAGNOSIS — E1169 Type 2 diabetes mellitus with other specified complication: Secondary | ICD-10-CM | POA: Diagnosis not present

## 2016-02-05 DIAGNOSIS — I10 Essential (primary) hypertension: Secondary | ICD-10-CM | POA: Diagnosis not present

## 2016-02-05 DIAGNOSIS — E782 Mixed hyperlipidemia: Secondary | ICD-10-CM | POA: Diagnosis not present

## 2016-02-05 DIAGNOSIS — R05 Cough: Secondary | ICD-10-CM | POA: Diagnosis not present

## 2016-02-05 DIAGNOSIS — J399 Disease of upper respiratory tract, unspecified: Secondary | ICD-10-CM | POA: Diagnosis not present

## 2016-02-25 DIAGNOSIS — Z6830 Body mass index (BMI) 30.0-30.9, adult: Secondary | ICD-10-CM | POA: Diagnosis not present

## 2016-02-25 DIAGNOSIS — R7303 Prediabetes: Secondary | ICD-10-CM | POA: Diagnosis not present

## 2016-02-25 DIAGNOSIS — H9312 Tinnitus, left ear: Secondary | ICD-10-CM | POA: Diagnosis not present

## 2016-02-25 DIAGNOSIS — R42 Dizziness and giddiness: Secondary | ICD-10-CM | POA: Diagnosis not present

## 2016-03-06 DIAGNOSIS — L03031 Cellulitis of right toe: Secondary | ICD-10-CM | POA: Diagnosis not present

## 2016-03-06 DIAGNOSIS — M79674 Pain in right toe(s): Secondary | ICD-10-CM | POA: Diagnosis not present

## 2016-03-06 DIAGNOSIS — B351 Tinea unguium: Secondary | ICD-10-CM | POA: Diagnosis not present

## 2016-03-10 DIAGNOSIS — N529 Male erectile dysfunction, unspecified: Secondary | ICD-10-CM | POA: Diagnosis not present

## 2016-03-10 DIAGNOSIS — Z7982 Long term (current) use of aspirin: Secondary | ICD-10-CM | POA: Diagnosis not present

## 2016-03-10 DIAGNOSIS — Z880 Allergy status to penicillin: Secondary | ICD-10-CM | POA: Diagnosis not present

## 2016-06-03 DIAGNOSIS — Z6830 Body mass index (BMI) 30.0-30.9, adult: Secondary | ICD-10-CM | POA: Diagnosis not present

## 2016-06-03 DIAGNOSIS — R42 Dizziness and giddiness: Secondary | ICD-10-CM | POA: Diagnosis not present

## 2016-06-03 DIAGNOSIS — R7309 Other abnormal glucose: Secondary | ICD-10-CM | POA: Diagnosis not present

## 2016-09-01 DIAGNOSIS — H8190 Unspecified disorder of vestibular function, unspecified ear: Secondary | ICD-10-CM | POA: Diagnosis not present

## 2016-09-01 DIAGNOSIS — Z23 Encounter for immunization: Secondary | ICD-10-CM | POA: Diagnosis not present

## 2016-09-01 DIAGNOSIS — Z6830 Body mass index (BMI) 30.0-30.9, adult: Secondary | ICD-10-CM | POA: Diagnosis not present

## 2016-09-01 DIAGNOSIS — R7303 Prediabetes: Secondary | ICD-10-CM | POA: Diagnosis not present

## 2016-11-27 DIAGNOSIS — N5231 Erectile dysfunction following radical prostatectomy: Secondary | ICD-10-CM | POA: Diagnosis not present

## 2016-11-27 DIAGNOSIS — C61 Malignant neoplasm of prostate: Secondary | ICD-10-CM | POA: Diagnosis not present

## 2016-11-27 DIAGNOSIS — Z8546 Personal history of malignant neoplasm of prostate: Secondary | ICD-10-CM | POA: Diagnosis not present

## 2016-12-30 DIAGNOSIS — R7303 Prediabetes: Secondary | ICD-10-CM | POA: Diagnosis not present

## 2017-02-23 DIAGNOSIS — Z7982 Long term (current) use of aspirin: Secondary | ICD-10-CM | POA: Diagnosis not present

## 2017-02-23 DIAGNOSIS — Z6833 Body mass index (BMI) 33.0-33.9, adult: Secondary | ICD-10-CM | POA: Diagnosis not present

## 2017-02-23 DIAGNOSIS — E669 Obesity, unspecified: Secondary | ICD-10-CM | POA: Diagnosis not present

## 2017-02-23 DIAGNOSIS — Z0000 Encounter for general adult medical examination without abnormal findings: Secondary | ICD-10-CM | POA: Diagnosis not present

## 2017-03-11 DIAGNOSIS — Z0000 Encounter for general adult medical examination without abnormal findings: Secondary | ICD-10-CM | POA: Diagnosis not present

## 2017-03-11 DIAGNOSIS — K589 Irritable bowel syndrome without diarrhea: Secondary | ICD-10-CM | POA: Diagnosis not present

## 2017-03-11 DIAGNOSIS — R7303 Prediabetes: Secondary | ICD-10-CM | POA: Diagnosis not present

## 2017-03-11 DIAGNOSIS — R7309 Other abnormal glucose: Secondary | ICD-10-CM | POA: Diagnosis not present

## 2017-03-30 ENCOUNTER — Telehealth: Payer: Self-pay

## 2017-03-30 NOTE — Telephone Encounter (Signed)
Pt calling to set up TCS . Please call him to schedule

## 2017-03-31 ENCOUNTER — Telehealth: Payer: Self-pay

## 2017-03-31 NOTE — Telephone Encounter (Signed)
I will call pt. However, last reminder sent in 2012.

## 2017-04-07 ENCOUNTER — Telehealth: Payer: Self-pay

## 2017-04-07 NOTE — Telephone Encounter (Signed)
See separate triage.  

## 2017-04-07 NOTE — Telephone Encounter (Signed)
LMOM to call.

## 2017-04-08 NOTE — Telephone Encounter (Signed)
Left message for a return call

## 2017-04-09 ENCOUNTER — Other Ambulatory Visit: Payer: Self-pay

## 2017-04-09 DIAGNOSIS — Z1211 Encounter for screening for malignant neoplasm of colon: Secondary | ICD-10-CM

## 2017-04-09 NOTE — Telephone Encounter (Signed)
See separate triage.  

## 2017-04-09 NOTE — Telephone Encounter (Addendum)
Gastroenterology Pre-Procedure Review  Request Date:04/07/2017 Requesting Physician: Dr. Gala Romney  Last colonoscopy 11/26/2001 by Dr. Gala Romney It was normal and next was to be in 10 years  PATIENT REVIEW QUESTIONS: The patient responded to the following health history questions as indicated:    1. Diabetes Melitis: no 2. Joint replacements in the past 12 months: no 3. Major health problems in the past 3 months: no 4. Has an artificial valve or MVP: no 5. Has a defibrillator: no 6. Has been advised in past to take antibiotics in advance of a procedure like teeth cleaning: no 7. Family history of colon cancer: no  8. Alcohol Use: no 9. History of sleep apnea: no  10. History of coronary artery or other vascular stents placed within the last 12 months: no    MEDICATIONS & ALLERGIES:    Patient reports the following regarding taking any blood thinners:   Plavix? no Aspirin? no Coumadin? no Brilinta? no Xarelto? no Eliquis? no Pradaxa? no Savaysa? no Effient? no  Patient confirms/reports the following medications:  Current Outpatient Prescriptions  Medication Sig Dispense Refill  . aspirin 81 MG tablet Take 81 mg by mouth daily.    . Cyanocobalamin (VITAMIN B-12 CR PO) Take by mouth.    . magnesium 30 MG tablet Take 500 mg by mouth daily.     . Multiple Vitamins-Minerals (CENTRUM SILVER PO) Take 1 tablet by mouth daily.    Marland Kitchen triamcinolone (KENALOG) 0.025 % cream Apply 1 application topically 2 (two) times daily.    . meclizine (ANTIVERT) 25 MG tablet Take 25 mg by mouth 3 (three) times daily as needed for dizziness.     No current facility-administered medications for this visit.     Patient confirms/reports the following allergies:  Allergies  Allergen Reactions  . Penicillins     No orders of the defined types were placed in this encounter.   AUTHORIZATION INFORMATION Primary Insurance:   ID #:  Group #:  Pre-Cert / Auth required:  Pre-Cert / Auth #:   Secondary  Insurance:  ID #: Group #:  Pre-Cert / Auth required:  Pre-Cert / Auth #:   SCHEDULE INFORMATION: Procedure has been scheduled as follows:  Date: 05/08/2017                 Time:  1:00 pm Location: [facility] Short Stay  This Gastroenterology Pre-Precedure Review Form is being routed to the following provider(s): R. Garfield Cornea, MD

## 2017-04-10 NOTE — Telephone Encounter (Signed)
Appropriate.

## 2017-04-23 MED ORDER — PEG 3350-KCL-NA BICARB-NACL 420 G PO SOLR: 4000.0000 mL | ORAL | 0 refills | Status: DC

## 2017-04-23 NOTE — Telephone Encounter (Signed)
Rx sent to the pharmacy and instructions mailed to pt.  

## 2017-04-28 NOTE — Telephone Encounter (Signed)
NO PA is needed for TCS 

## 2017-05-08 ENCOUNTER — Encounter (HOSPITAL_COMMUNITY)
Admission: RE | Disposition: A | Discharge status: Home / Self Care | Payer: Self-pay | Source: Ambulatory Visit | Attending: Internal Medicine

## 2017-05-08 ENCOUNTER — Encounter (HOSPITAL_COMMUNITY): Payer: Self-pay | Admitting: *Deleted

## 2017-05-08 ENCOUNTER — Ambulatory Visit (HOSPITAL_COMMUNITY)
Admission: RE | Admit: 2017-05-08 | Discharge: 2017-05-08 | Disposition: A | Discharge status: Home / Self Care | Payer: Medicare HMO | Source: Ambulatory Visit | Attending: Internal Medicine | Admitting: Internal Medicine

## 2017-05-08 DIAGNOSIS — Z79899 Other long term (current) drug therapy: Secondary | ICD-10-CM | POA: Diagnosis not present

## 2017-05-08 DIAGNOSIS — Z8546 Personal history of malignant neoplasm of prostate: Secondary | ICD-10-CM | POA: Diagnosis not present

## 2017-05-08 DIAGNOSIS — Z7982 Long term (current) use of aspirin: Secondary | ICD-10-CM | POA: Insufficient documentation

## 2017-05-08 DIAGNOSIS — Z1211 Encounter for screening for malignant neoplasm of colon: Secondary | ICD-10-CM

## 2017-05-08 DIAGNOSIS — Z1212 Encounter for screening for malignant neoplasm of rectum: Secondary | ICD-10-CM

## 2017-05-08 DIAGNOSIS — R42 Dizziness and giddiness: Secondary | ICD-10-CM | POA: Insufficient documentation

## 2017-05-08 HISTORY — PX: COLONOSCOPY: SHX5424

## 2017-05-08 SURGERY — COLONOSCOPY
Anesthesia: Moderate Sedation

## 2017-05-08 MED ORDER — ONDANSETRON 4 MG PO TBDP
ORAL_TABLET | ORAL | Status: AC
  Filled 2017-05-08: qty 1

## 2017-05-08 MED ORDER — ONDANSETRON HCL 4 MG/2ML IJ SOLN
INTRAMUSCULAR | Status: AC
  Filled 2017-05-08: qty 2

## 2017-05-08 MED ORDER — ONDANSETRON HCL 4 MG/2ML IJ SOLN
INTRAMUSCULAR | Status: DC | PRN
  Administered 2017-05-08: 4 mg via INTRAVENOUS

## 2017-05-08 MED ORDER — MEPERIDINE HCL 100 MG/ML IJ SOLN
INTRAMUSCULAR | Status: AC
  Filled 2017-05-08: qty 2

## 2017-05-08 MED ORDER — MEPERIDINE HCL 100 MG/ML IJ SOLN
INTRAMUSCULAR | Status: DC | PRN
  Administered 2017-05-08: 50 mg via INTRAVENOUS
  Administered 2017-05-08: 25 mg via INTRAVENOUS

## 2017-05-08 MED ORDER — MIDAZOLAM HCL 5 MG/5ML IJ SOLN
INTRAMUSCULAR | Status: AC
  Filled 2017-05-08: qty 10

## 2017-05-08 MED ORDER — SODIUM CHLORIDE 0.9 % IV SOLN
INTRAVENOUS | Status: DC
  Administered 2017-05-08: 1000 mL via INTRAVENOUS

## 2017-05-08 MED ORDER — STERILE WATER FOR IRRIGATION IR SOLN
Status: DC | PRN
  Administered 2017-05-08: 2.5 mL

## 2017-05-08 MED ORDER — MIDAZOLAM HCL 5 MG/5ML IJ SOLN
INTRAMUSCULAR | Status: DC | PRN
  Administered 2017-05-08: 1 mg via INTRAVENOUS
  Administered 2017-05-08: 2 mg via INTRAVENOUS

## 2017-05-08 NOTE — Op Note (Signed)
[facility] Patient Name: Stanley Bass Procedure Date: 05/08/2017 12:45 PM MRN: [id number] Date of Birth: March 04, 1946 Attending MD: Norvel Richards , MD CSN: [id number] Age: 71 Admit Type: Outpatient Procedure:                Colonoscopy Indications:              Screening for colorectal malignant neoplasm Providers:                Norvel Richards, MD, Lurline Del, RN, Purcell Nails.                            [place], [employer] Referring MD:              Medicines:                Midazolam 3 mg IV, Meperidine 75 mg IV, Ondansetron                            4 mg IV Complications:            No immediate complications. Estimated Blood Loss:     Estimated blood loss: none. Procedure:                Pre-Anesthesia Assessment:                           - Prior to the procedure, a History and Physical                            was performed, and patient medications and                            allergies were reviewed. The patient's tolerance of                            previous anesthesia was also reviewed. The risks                            and benefits of the procedure and the sedation                            options and risks were discussed with the patient.                            All questions were answered, and informed consent                            was obtained. Prior Anticoagulants: The patient has                            taken no previous anticoagulant or antiplatelet                            agents. ASA Grade Assessment: II - A patient with  mild systemic disease. After reviewing the risks                            and benefits, the patient was deemed in                            satisfactory condition to undergo the procedure.                           After obtaining informed consent, the colonoscope                            was passed under direct vision. Throughout the                            procedure, the  patient's blood pressure, pulse, and                            oxygen saturations were monitored continuously. The                            EC-[id number] ([id number]) scope was introduced through                            the anus and advanced to the the cecum, identified                            by appendiceal orifice and ileocecal valve. The                            colonoscopy was performed without difficulty. The                            patient tolerated the procedure well. The entire                            colon was well visualized. The ileocecal valve,                            appendiceal orifice, and rectum were photographed.                            The quality of the bowel preparation was adequate. Scope In: 1:03:39 PM Scope Out: 1:15:38 PM Scope Withdrawal Time: 0 hours 7 minutes 26 seconds  Total Procedure Duration: 0 hours 11 minutes 59 seconds  Findings:      The perianal and digital rectal examinations were normal.      The entire examined colon appeared normal on direct and retroflexion       views. Impression:               - The entire examined colon is normal on direct and                            retroflexion views.                           -  No specimens collected. Moderate Sedation:      Moderate (conscious) sedation was administered by the endoscopy nurse       and supervised by the endoscopist. The following parameters were       monitored: oxygen saturation, heart rate, blood pressure, respiratory       rate, EKG, adequacy of pulmonary ventilation, and response to care.       Total physician intraservice time was 21 minutes. Recommendation:           - Patient has a contact number available for                            emergencies. The signs and symptoms of potential                            delayed complications were discussed with the                            patient. Return to normal activities tomorrow.                            Written  discharge instructions were provided to the                            patient.                           - Resume previous diet.                           - Continue present medications.                           - Repeat colonoscopy in 10 years for screening                            purposes.                           - Return to GI clinic PRN. Procedure Code(s):        --- Professional ---                           [phone], Colonoscopy, flexible; diagnostic, including                            collection of specimen(s) by brushing or washing,                            when performed (separate procedure)                           99152, Moderate sedation services provided by the                            same physician or other qualified health care  professional performing the diagnostic or                            therapeutic service that the sedation supports,                            requiring the presence of an independent trained                            observer to assist in the monitoring of the                            patient's level of consciousness and physiological                            status; initial 15 minutes of intraservice time,                            patient age 81 years or older Diagnosis Code(s):        --- Professional ---                           Z12.11, Encounter for screening for malignant                            neoplasm of colon CPT copyright 2016 [employer] Association. All rights reserved. The codes documented in this report are preliminary and upon coder review may  be revised to meet current compliance requirements. Cristopher Estimable. Romney Compean, MD Norvel Richards, MD 05/08/2017 1:20:44 PM This report has been signed electronically. Number of Addenda: 0

## 2017-05-08 NOTE — Discharge Instructions (Signed)
°  Colonoscopy Discharge Instructions  Read the instructions outlined below and refer to this sheet in the next few weeks. These discharge instructions provide you with general information on caring for yourself after you leave the hospital. Your doctor may also give you specific instructions. While your treatment has been planned according to the most current medical practices available, unavoidable complications occasionally occur. If you have any problems or questions after discharge, call Dr. Gala Romney at [phone]. ACTIVITY  You may resume your regular activity, but move at a slower pace for the next 24 hours.   Take frequent rest periods for the next 24 hours.   Walking will help get rid of the air and reduce the bloated feeling in your belly (abdomen).   No driving for 24 hours (because of the medicine (anesthesia) used during the test).    Do not sign any important legal documents or operate any machinery for 24 hours (because of the anesthesia used during the test).  NUTRITION  Drink plenty of fluids.   You may resume your normal diet as instructed by your doctor.   Begin with a light meal and progress to your normal diet. Heavy or fried foods are harder to digest and may make you feel sick to your stomach (nauseated).   Avoid alcoholic beverages for 24 hours or as instructed.  MEDICATIONS  You may resume your normal medications unless your doctor tells you otherwise.  WHAT YOU CAN EXPECT TODAY  Some feelings of bloating in the abdomen.   Passage of more gas than usual.   Spotting of blood in your stool or on the toilet paper.  IF YOU HAD POLYPS REMOVED DURING THE COLONOSCOPY:  No aspirin products for 7 days or as instructed.   No alcohol for 7 days or as instructed.   Eat a soft diet for the next 24 hours.  FINDING OUT THE RESULTS OF YOUR TEST Not all test results are available during your visit. If your test results are not back during the visit, make an appointment  with your caregiver to find out the results. Do not assume everything is normal if you have not heard from your caregiver or the medical facility. It is important for you to follow up on all of your test results.  SEEK IMMEDIATE MEDICAL ATTENTION IF:  You have more than a spotting of blood in your stool.   Your belly is swollen (abdominal distention).   You are nauseated or vomiting.   You have a temperature over 101.   You have abdominal pain or discomfort that is severe or gets worse throughout the day.    Your colonoscopy was normal today  I recommend one more screening colonoscopy in 10 years as long overall health permits.

## 2017-05-08 NOTE — H&P (Signed)
@  FUXN@   Primary Care Physician:  [name], MD Primary Gastroenterologist:  Dr. Gala Romney  Pre-Procedure History & Physical: HPI:  Stanley Bass is a 71 y.o. male is here for a screening colonoscopy. No bowel symptoms. Reportedly negative colonoscopy 10 years ago.  Past Medical History:  Diagnosis Date  . Prostate cancer ([place])   . Vertigo     Past Surgical History:  Procedure Laterality Date  . PROSTATE SURGERY      Prior to Admission medications   Medication Sig Start Date End Date Taking? Authorizing Provider  aspirin EC 81 MG tablet Take 81 mg by mouth daily.   Yes [provider]  Magnesium 500 MG TABS Take 500 mg by mouth daily.   Yes [provider]  meclizine (ANTIVERT) 25 MG tablet Take 25 mg by mouth 3 (three) times daily as needed for dizziness.   Yes [provider]  Multiple Vitamin (MULTIVITAMIN WITH MINERALS) TABS tablet Take 1 tablet by mouth daily. Men's 50+   Yes [provider]  polyethylene glycol-electrolytes (TRILYTE) 420 g solution Take 4,000 mLs by mouth as directed. 04/23/17  Yes Aaren Krog, Cristopher Estimable, MD  triamcinolone cream (KENALOG) 0.1 % Apply 1 application topically 2 (two) times daily as needed (for dry/irritated skin).   Yes [provider]  vitamin B-12 (CYANOCOBALAMIN) 1000 MCG tablet Take 1,000 mcg by mouth daily.   Yes [provider]    Allergies as of 04/09/2017 - Review Complete 04/07/2017  Allergen Reaction Noted  . Penicillins      Family History  Problem Relation Age of Onset  . Diabetes Mother   . Stroke Father   . Heart attack Brother     Social History   Social History  . Marital status: Married    Spouse name: Blanch Media  . Number of children: 3  . Years of education: HS   Occupational History  . Retired Other   Social History Main Topics  . Smoking status: Never Smoker  . Smokeless tobacco: Never Used  . Alcohol use No  . Drug use: No  . Sexual activity: Not on file    Other Topics Concern  . Not on file   Social History Narrative   Patient lives at home with his spouse.   Caffeine Use: 1-2 cups daily    Review of Systems: See HPI, otherwise negative ROS  Physical Exam: BP 131/70   Pulse (!) 107   Temp 98 F (36.7 C)   Resp 18   Ht 5\' 7"  (1.702 m)   Wt 200 lb (90.7 kg)   SpO2 100%   BMI 31.32 kg/m  General:   Alert,  Well-developed, well-nourished, pleasant and cooperative in NAD Lungs:  Clear throughout to auscultation.   No wheezes, crackles, or rhonchi. No acute distress. Heart:  Regular rate and rhythm; no murmurs, clicks, rubs,  or gallops. Abdomen:  Soft, nontender and nondistended. No masses, hepatosplenomegaly or hernias noted. Normal bowel sounds, without guarding, and without rebound.    Impression/Plan: Stanley Bass is now here to undergo a screening colonoscopy.  Risks, benefits, limitations, imponderables and alternatives regarding colonoscopy have been reviewed with the patient. Questions have been answered. All parties agreeable.     Notice:  This dictation was prepared with Dragon dictation along with smaller phrase technology. Any transcriptional errors that result from this process are unintentional and may not be corrected upon review.

## 2017-05-13 ENCOUNTER — Encounter (HOSPITAL_COMMUNITY): Payer: Self-pay | Admitting: Internal Medicine

## 2017-06-16 DIAGNOSIS — R7303 Prediabetes: Secondary | ICD-10-CM | POA: Diagnosis not present

## 2017-06-18 DIAGNOSIS — E118 Type 2 diabetes mellitus with unspecified complications: Secondary | ICD-10-CM | POA: Diagnosis not present

## 2017-06-25 DIAGNOSIS — R69 Illness, unspecified: Secondary | ICD-10-CM | POA: Diagnosis not present

## 2017-08-13 DIAGNOSIS — R69 Illness, unspecified: Secondary | ICD-10-CM | POA: Diagnosis not present

## 2017-08-31 DIAGNOSIS — R69 Illness, unspecified: Secondary | ICD-10-CM | POA: Diagnosis not present

## 2017-09-18 ENCOUNTER — Emergency Department (HOSPITAL_COMMUNITY): Payer: Medicare HMO

## 2017-09-18 ENCOUNTER — Encounter (HOSPITAL_COMMUNITY): Payer: Self-pay

## 2017-09-18 ENCOUNTER — Emergency Department (HOSPITAL_COMMUNITY)
Admission: EM | Admit: 2017-09-18 | Discharge: 2017-09-18 | Disposition: A | Discharge status: Home / Self Care | Payer: Medicare HMO | Attending: Emergency Medicine | Admitting: Emergency Medicine

## 2017-09-18 DIAGNOSIS — Y92410 Unspecified street and highway as the place of occurrence of the external cause: Secondary | ICD-10-CM | POA: Insufficient documentation

## 2017-09-18 DIAGNOSIS — Y999 Unspecified external cause status: Secondary | ICD-10-CM | POA: Diagnosis not present

## 2017-09-18 DIAGNOSIS — S199XXA Unspecified injury of neck, initial encounter: Secondary | ICD-10-CM | POA: Diagnosis not present

## 2017-09-18 DIAGNOSIS — M7918 Myalgia, other site: Secondary | ICD-10-CM

## 2017-09-18 DIAGNOSIS — C61 Malignant neoplasm of prostate: Secondary | ICD-10-CM | POA: Insufficient documentation

## 2017-09-18 DIAGNOSIS — M791 Myalgia, unspecified site: Secondary | ICD-10-CM | POA: Insufficient documentation

## 2017-09-18 DIAGNOSIS — Z79899 Other long term (current) drug therapy: Secondary | ICD-10-CM | POA: Insufficient documentation

## 2017-09-18 DIAGNOSIS — Z7982 Long term (current) use of aspirin: Secondary | ICD-10-CM | POA: Insufficient documentation

## 2017-09-18 DIAGNOSIS — M545 Low back pain, unspecified: Secondary | ICD-10-CM

## 2017-09-18 DIAGNOSIS — M542 Cervicalgia: Secondary | ICD-10-CM | POA: Diagnosis not present

## 2017-09-18 DIAGNOSIS — S3992XA Unspecified injury of lower back, initial encounter: Secondary | ICD-10-CM | POA: Diagnosis present

## 2017-09-18 DIAGNOSIS — Y9389 Activity, other specified: Secondary | ICD-10-CM | POA: Diagnosis not present

## 2017-09-18 DIAGNOSIS — S299XXA Unspecified injury of thorax, initial encounter: Secondary | ICD-10-CM | POA: Diagnosis not present

## 2017-09-18 MED ORDER — HYDROCODONE-ACETAMINOPHEN 5-325 MG PO TABS: ORAL_TABLET | ORAL | 0 refills | Status: DC

## 2017-09-18 MED ORDER — METHOCARBAMOL 500 MG PO TABS: 500.0000 mg | ORAL_TABLET | Freq: Three times a day (TID) | ORAL | 0 refills | Status: DC | PRN

## 2017-09-18 NOTE — Discharge Instructions (Signed)
Take the prescriptions as directed.  Apply moist heat or ice to the area(s) of discomfort, for 15 minutes at a time, several times per day for the next few days.  Do not fall asleep on a heating or ice pack.  Call your regular medical doctor today to schedule a follow up appointment next week.  Return to the Emergency Department immediately if worsening. ° °
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
## 2017-09-22 DIAGNOSIS — M25512 Pain in left shoulder: Secondary | ICD-10-CM | POA: Diagnosis not present

## 2017-09-22 DIAGNOSIS — M549 Dorsalgia, unspecified: Secondary | ICD-10-CM | POA: Diagnosis not present

## 2017-09-22 DIAGNOSIS — M542 Cervicalgia: Secondary | ICD-10-CM | POA: Diagnosis not present

## 2017-09-28 ENCOUNTER — Other Ambulatory Visit (HOSPITAL_COMMUNITY): Payer: Self-pay | Admitting: Family Medicine

## 2017-09-28 DIAGNOSIS — E041 Nontoxic single thyroid nodule: Secondary | ICD-10-CM

## 2017-10-01 ENCOUNTER — Ambulatory Visit (HOSPITAL_COMMUNITY): Payer: Medicare HMO | Attending: Family Medicine

## 2017-10-01 DIAGNOSIS — M545 Low back pain, unspecified: Secondary | ICD-10-CM

## 2017-10-01 DIAGNOSIS — R531 Weakness: Secondary | ICD-10-CM | POA: Diagnosis present

## 2017-10-01 DIAGNOSIS — R2689 Other abnormalities of gait and mobility: Secondary | ICD-10-CM | POA: Diagnosis present

## 2017-10-01 DIAGNOSIS — R269 Unspecified abnormalities of gait and mobility: Secondary | ICD-10-CM | POA: Insufficient documentation

## 2017-10-01 NOTE — Patient Instructions (Signed)
  LOWER TRUNK ROTATIONS - LTR 30seconds, 3 times  Lying on your back with your knees bent, gently move your knees side-to-side.   SINGLE KNEE TO CHEST STRETCH - SKTC  30seconds, 3 times  While Lying on your back, hold your knee and gently pull it up towards your chest.       SEATED HAMSTRING STRETCH 30seconds, 3 times  While seated, rest your heel on the floor with your knee straight and gently lean forward until a stretch is felt behind your knee/thigh.      Posture Correction with Lumbar Roll  Sit in firm chair with hips as far back as possible and feet flat on floor. Place roll or rolled towel behind lower back and sit back so back is supported by roll.        Prone Quad Stretch 30seconds, 3 times  Lie down flat on your stomach. Wrap a strap (belt, towel, dog leash) around the top of one of your feet and pull the strap across your opposite shoulder so that your knee starts to curl up to your body. Pull until a stretch is felt across the front of your thigh.
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
## 2017-10-06 ENCOUNTER — Ambulatory Visit (HOSPITAL_COMMUNITY)
Admission: RE | Admit: 2017-10-06 | Discharge: 2017-10-06 | Disposition: A | Discharge status: Home / Self Care | Payer: Medicare HMO | Source: Ambulatory Visit | Attending: Family Medicine | Admitting: Family Medicine

## 2017-10-06 DIAGNOSIS — E042 Nontoxic multinodular goiter: Secondary | ICD-10-CM | POA: Diagnosis not present

## 2017-10-06 DIAGNOSIS — E041 Nontoxic single thyroid nodule: Secondary | ICD-10-CM

## 2017-10-07 ENCOUNTER — Encounter (HOSPITAL_COMMUNITY): Payer: Self-pay

## 2017-10-07 ENCOUNTER — Ambulatory Visit (HOSPITAL_COMMUNITY): Payer: Medicare HMO

## 2017-10-07 DIAGNOSIS — M542 Cervicalgia: Secondary | ICD-10-CM | POA: Diagnosis not present

## 2017-10-07 DIAGNOSIS — R2689 Other abnormalities of gait and mobility: Secondary | ICD-10-CM

## 2017-10-07 DIAGNOSIS — M545 Low back pain, unspecified: Secondary | ICD-10-CM

## 2017-10-07 DIAGNOSIS — R269 Unspecified abnormalities of gait and mobility: Secondary | ICD-10-CM

## 2017-10-07 DIAGNOSIS — R531 Weakness: Secondary | ICD-10-CM

## 2017-10-07 NOTE — Therapy (Signed)
[place], [place], [address] Phone: [phone]   Fax:  [phone]  Physical Therapy Treatment  Patient Details  Name: Stanley Bass MRN: [id number] Date of Birth: 1946/11/23 Referring Provider: [name], MD   Encounter Date: 10/07/2017  PT End of Session - 10/07/17 0829    Visit Number  2    Number of Visits  9    Date for PT Re-Evaluation  10/29/17    Authorization Type  Aetna Medicare [facility]    Authorization Time Period  10/01/2017-10/29/2017    PT Start Time  0815    PT Stop Time  0903    PT Time Calculation (min)  48 min    Activity Tolerance  Patient tolerated treatment well    Behavior During Therapy  [facility] for tasks assessed/performed       Past Medical History:  Diagnosis Date  . Prostate cancer ([place])   . Vertigo     Past Surgical History:  Procedure Laterality Date  . PROSTATE SURGERY      There were no vitals filed for this visit.  Subjective Assessment - 10/07/17 0820    Subjective  Pt reports LBP with movement mainly twisting or sitting for long periods of time stiffness.  Continues to have Lt shoulder pain 5/10 soreness.  Reports compliance with HEP daily,    Patient Stated Goals  To improve pain and ability to turn his neck    Currently in Pain?  Yes    Pain Score  5     Pain Location  Back back pain with movement, Lt shoulder    Pain Orientation  Left;Lower    Pain Descriptors / Indicators  Tightness    Pain Onset  1 to 4 weeks ago    Pain Frequency  Intermittent    Aggravating Factors   movement and twisting    Pain Relieving Factors  heat    Effect of Pain on Daily Activities  increased difficulty         [facility] PT Assessment - 10/07/17 0001      Assessment   Medical Diagnosis  LBP    Referring Provider  [name], MD    Onset Date/Surgical Date  09/17/17    Prior Therapy  none      Precautions   Precautions  None      Standardized Balance Assessment   Standardized Balance Assessment  Five Times Sit to Stand;Timed Up and Go Test    Five times sit to stand comments   15.52" no HHA      Timed Up and Go Test   TUG  Normal TUG    Normal TUG (seconds)  8.16                  [facility] Adult PT Treatment/Exercise - 10/07/17 0001      Exercises   Exercises  Lumbar      Lumbar Exercises: Stretches   Active Hamstring Stretch  3 reps;30 seconds    Active Hamstring Stretch Limitations  supine    Single Knee to Chest Stretch  3 reps;30 seconds    Single Knee to Chest Stretch Limitations  towel assistance    Lower Trunk Rotation  10 seconds    Lower Trunk Rotation Limitations  10x 10"       Lumbar Exercises: Seated   Other Seated Lumbar Exercises  Posture education with lumbar roll      Lumbar Exercises: Supine  Ab Set  10 reps;5 seconds    AB Set Limitations  cueing for proper activation    Bridge  10 reps      Manual Therapy   Manual Therapy  Soft tissue mobilization    Manual therapy comments  Manual complete separate than rest of tx    Soft tissue mobilization  Rt sidelying with pillow between knees; manual to piriformis and gluteal musculature to reduce spasms/tightness             PT Education - 10/07/17 0830    Education provided  Yes    Education Details  reviewed goals, assured compliance with cueing to improve form with HEP and copy of eval given to pt.  Educated on importance with bed mobility and discussed seated posture    Person(s) Educated  Patient    Methods  Explanation;Demonstration;Handout;Verbal cues    Comprehension  Verbalized understanding;Returned demonstration;Need further instruction       PT Short Term Goals - 10/01/17 1545      PT SHORT TERM GOAL #1   Title  Pt will be independent with HEP to improve pain and functional mobility.     Time  2    Period  Weeks    Status  New    Target Date  10/15/17      PT SHORT TERM GOAL #2   Title  Patient will have at most 3/10 pain throughout his  day while completing functional daily tasks and ambulating through the grocery store.     Time  2    Period  Weeks    Status  New      PT SHORT TERM GOAL #3   Title  Patient will have MMT grade improvement by 1 grade to improve hip stability and decrease stress on low back.     Time  2    Period  Weeks    Status  New        PT Long Term Goals - 10/01/17 1547      PT LONG TERM GOAL #1   Title  Patient will have improved MMT grade by 2 to improve functional strength and protection of low back during functional squatting activities to pick up object at home.     Time  4    Period  Weeks    Status  New      PT LONG TERM GOAL #2   Title  Patient will demonstrate indepedence with bed mobility with desired for to protect low back 3/5 trials.     Time  4    Period  Weeks    Status  New      PT LONG TERM GOAL #3   Title  Patient will demonstrate appropriate form to pick object off the floor with pain < 2/10 to decrease risk of reinjury in the future.     Time  4    Period  Weeks    Status  New      PT LONG TERM GOAL #4   Title  Patient will have improved FOTO score by 20% to indicate improved perception of functional mobility without pain.     Time  4    Period  Weeks    Status  New            Plan - 10/07/17 6962    Clinical Impression Statement  Reviewed goals, assured compliance and proper form with HEP and copy of eval given to pt.  Educated on  bed mobility mechanics. Session focus on lumbar mobilty and core/proximal strengthening.  Pt able to demonstrate appropriate form wiht min cueing to improve stabiltiy with tasks.  Functional testing complete with 5STS and TUG, able to complete with no AD and no HHA required.  EOS with manual to address spasms Lt piriformis and gluteal region, pt hypersenitive with initial contact, reports of relief wiht STM and pressure point relief.      Clinical Impairments Affecting Rehab Potential  (-) mechanism of injury, multiple pain sites;  (+) previous active at gym     PT Frequency  2x / week    PT Duration  4 weeks    PT Treatment/Interventions  ADLs/Self Care Home Management;Electrical Stimulation;Cryotherapy;Gait training;Stair training;Functional mobility training;Therapeutic activities;Therapeutic exercise;Balance training;Neuromuscular re-education;Patient/family education;Manual techniques;Dry needling    PT Next Visit Plan  Focus initially on manual and core stability; postural education and bed mobility to protect back     PT Home Exercise Plan  Eval: LTR, SKTC, lumbar roll education, seated hamstring stretch, prone quad stretch       Patient will benefit from skilled therapeutic intervention in order to improve the following deficits and impairments:  Abnormal gait, Decreased activity tolerance, Decreased strength, Pain, Difficulty walking, Decreased balance, Increased muscle spasms, Decreased range of motion, Decreased coordination, Impaired flexibility, Decreased mobility, Postural dysfunction, Improper body mechanics  Visit Diagnosis: Acute midline low back pain without sciatica  Decreased strength  Decreased mobility  Abnormality of gait     Problem List Patient Active Problem List   Diagnosis Date Noted  . Prostate cancer ([place]) 09/15/2014  . KNEE, ARTHRITIS, DEGEN./OSTEO 10/02/2009  . [facility] OF BREATH 01/16/2009   Ihor Austin, [place]; [place]  Aldona Lento 10/07/2017, 12:54 PM  [facility] [address] [place], [place], [address] Phone: [phone]   Fax:  [phone]  Name: Stanley Bass MRN: [id number] Date of Birth: 18-Mar-1946

## 2017-10-07 NOTE — Patient Instructions (Signed)
Hamstring Step 3    Left leg in maximal straight leg raise, heel at maximal stretch, straighten knee further by tightening knee cap. Warning: Intense stretch. Stay within tolerance. Hold 30 seconds. Relax knee cap only. Repeat 3 times.  Copyright  VHI. All rights reserved.   

## 2017-10-09 ENCOUNTER — Encounter (HOSPITAL_COMMUNITY): Payer: Self-pay

## 2017-10-09 ENCOUNTER — Ambulatory Visit (HOSPITAL_COMMUNITY): Payer: Medicare HMO

## 2017-10-09 DIAGNOSIS — M545 Low back pain, unspecified: Secondary | ICD-10-CM

## 2017-10-09 DIAGNOSIS — R269 Unspecified abnormalities of gait and mobility: Secondary | ICD-10-CM

## 2017-10-09 DIAGNOSIS — R2689 Other abnormalities of gait and mobility: Secondary | ICD-10-CM

## 2017-10-09 DIAGNOSIS — R531 Weakness: Secondary | ICD-10-CM
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
## 2017-10-12 ENCOUNTER — Ambulatory Visit (HOSPITAL_COMMUNITY): Payer: Medicare HMO

## 2017-10-12 ENCOUNTER — Encounter (HOSPITAL_COMMUNITY): Payer: Self-pay

## 2017-10-12 ENCOUNTER — Telehealth (HOSPITAL_COMMUNITY): Payer: Self-pay | Admitting: Family Medicine

## 2017-10-12 ENCOUNTER — Other Ambulatory Visit: Payer: Self-pay

## 2017-10-12 DIAGNOSIS — M545 Low back pain, unspecified: Secondary | ICD-10-CM

## 2017-10-12 DIAGNOSIS — R269 Unspecified abnormalities of gait and mobility: Secondary | ICD-10-CM

## 2017-10-12 DIAGNOSIS — R2689 Other abnormalities of gait and mobility: Secondary | ICD-10-CM

## 2017-10-12 DIAGNOSIS — R531 Weakness: Secondary | ICD-10-CM
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
## 2017-10-12 NOTE — Telephone Encounter (Signed)
10/12/17  pt came in on 11/19 thinking he had an appt so we just switched the days since he was in the office

## 2017-10-13 ENCOUNTER — Ambulatory Visit (HOSPITAL_COMMUNITY): Payer: Medicare HMO

## 2017-10-14 ENCOUNTER — Encounter (HOSPITAL_COMMUNITY): Payer: Self-pay

## 2017-10-14 ENCOUNTER — Ambulatory Visit (HOSPITAL_COMMUNITY): Payer: Medicare HMO

## 2017-10-14 DIAGNOSIS — R269 Unspecified abnormalities of gait and mobility: Secondary | ICD-10-CM

## 2017-10-14 DIAGNOSIS — R2689 Other abnormalities of gait and mobility: Secondary | ICD-10-CM

## 2017-10-14 DIAGNOSIS — M545 Low back pain, unspecified: Secondary | ICD-10-CM

## 2017-10-14 DIAGNOSIS — R531 Weakness: Secondary | ICD-10-CM

## 2017-10-14 NOTE — Therapy (Signed)
[place], [place], [address] Phone: [phone]   Fax:  [phone]  Physical Therapy Treatment  Patient Details  Name: Stanley Bass MRN: [id number] Date of Birth: 12-17-1945 Referring Provider: [name], MD   Encounter Date: 10/14/2017  PT End of Session - 10/14/17 0827    Visit Number  5    Number of Visits  9    Date for PT Re-Evaluation  10/29/17    Authorization Type  [employer] Medicare [facility]    Authorization Time Period  10/01/2017-10/29/2017    PT Start Time  0820    PT Stop Time  0902    PT Time Calculation (min)  42 min    Activity Tolerance  Patient tolerated treatment well;No increased pain    Behavior During Therapy  WFL for tasks assessed/performed       Past Medical History:  Diagnosis Date  . Prostate cancer ([place])   . Vertigo     Past Surgical History:  Procedure Laterality Date  . COLONOSCOPY N/A 05/08/2017   Procedure: COLONOSCOPY;  Surgeon: Daneil Dolin, MD;  [facility];  Service: Endoscopy;  Laterality: N/A;  1:00 pm  . PROSTATE SURGERY      There were no vitals filed for this visit.  Subjective Assessment - 10/14/17 0825    Subjective  Pt stated he a feeling better today, pain scale 3/10 in center of lower back.      Patient Stated Goals  To improve pain and ability to turn his neck    Currently in Pain?  Yes    Pain Score  3     Pain Location  Back    Pain Orientation  Lower;Left    Pain Descriptors / Indicators  Sore;Tender    Pain Type  Acute pain    Pain Onset  1 to 4 weeks ago    Pain Frequency  Intermittent    Aggravating Factors   movement and twisting    Pain Relieving Factors  heat and NSAIDs    Effect of Pain on Daily Activities  increased difficutly                      [facility] Adult PT Treatment/Exercise - 10/14/17 0001      Lumbar Exercises: Stretches   Lower Trunk Rotation  10 seconds    Lower Trunk Rotation Limitations  10x  10"     Piriformis Stretch  2 reps;30 seconds;Limitations    Piriformis Stretch Limitations  supine      Lumbar Exercises: Standing   Functional Squats  10 reps;Other (comment) 3D hip excursion    Other Standing Lumbar Exercises  forward step up with theraband shoulder extension to faciliate lumbar spine extension, 10x 10 BLE      Lumbar Exercises: Supine   Bridge  15 reps;3 seconds      Lumbar Exercises: Quadruped   Other Quadruped Lumbar Exercises  child's pose 2x 30"      Manual Therapy   Manual Therapy  Soft tissue mobilization    Manual therapy comments  Manual complete separate than rest of tx    Soft tissue mobilization  Rt sidelying with pillow between knees; manual to gluteal musculature to reduce spasms/tightness               PT Short Term Goals - 10/01/17 1545      PT SHORT TERM GOAL #1   Title  Pt  will be independent with HEP to improve pain and functional mobility.     Time  2    Period  Weeks    Status  New    Target Date  10/15/17      PT SHORT TERM GOAL #2   Title  Patient will have at most 3/10 pain throughout his day while completing functional daily tasks and ambulating through the grocery store.     Time  2    Period  Weeks    Status  New      PT SHORT TERM GOAL #3   Title  Patient will have MMT grade improvement by 1 grade to improve hip stability and decrease stress on low back.     Time  2    Period  Weeks    Status  New        PT Long Term Goals - 10/01/17 1547      PT LONG TERM GOAL #1   Title  Patient will have improved MMT grade by 2 to improve functional strength and protection of low back during functional squatting activities to pick up object at home.     Time  4    Period  Weeks    Status  New      PT LONG TERM GOAL #2   Title  Patient will demonstrate indepedence with bed mobility with desired for to protect low back 3/5 trials.     Time  4    Period  Weeks    Status  New      PT LONG TERM GOAL #3   Title  Patient  will demonstrate appropriate form to pick object off the floor with pain < 2/10 to decrease risk of reinjury in the future.     Time  4    Period  Weeks    Status  New      PT LONG TERM GOAL #4   Title  Patient will have improved FOTO score by 20% to indicate improved perception of functional mobility without pain.     Time  4    Period  Weeks    Status  New            Plan - 10/14/17 0902    Clinical Impression Statement  Added child's pose and 3D excursion to improve lumbar and hip mobility.  Pt progressing well with reports of decrease pain with activities and improved mobility with therex.  EOS with STM to address tightness and spasm on Lt piriformis and QL for pain control.  No reports of increased pain through session.      Rehab Potential  Good    Clinical Impairments Affecting Rehab Potential  (-) mechanism of injury, multiple pain sites; (+) previous active at gym     PT Frequency  2x / week    PT Duration  4 weeks    PT Treatment/Interventions  ADLs/Self Care Home Management;Electrical Stimulation;Cryotherapy;Gait training;Stair training;Functional mobility training;Therapeutic activities;Therapeutic exercise;Balance training;Neuromuscular re-education;Patient/family education;Manual techniques;Dry needling    PT Next Visit Plan  Focus initially on manual and core stability; postural education. Trial with poe next session.  Advance standing exercies for functional hip strengthening.     PT Home Exercise Plan  Eval: LTR, SKTC, lumbar roll education, seated hamstring stretch, prone quad stretch       Patient will benefit from skilled therapeutic intervention in order to improve the following deficits and impairments:  Abnormal gait, Decreased activity tolerance, Decreased strength, Pain,  Difficulty walking, Decreased balance, Increased muscle spasms, Decreased range of motion, Decreased coordination, Impaired flexibility, Decreased mobility, Postural dysfunction, Improper body  mechanics  Visit Diagnosis: Acute midline low back pain without sciatica  Decreased strength  Decreased mobility  Abnormality of gait     Problem List Patient Active Problem List   Diagnosis Date Noted  . Prostate cancer ([place]) 09/15/2014  . KNEE, ARTHRITIS, DEGEN./OSTEO 10/02/2009  . [facility] OF BREATH 01/16/2009   Ihor [place], [place]; [place]   Aldona Lento 10/14/2017, 9:18 AM  [place], [place], [address] Phone: [phone]   Fax:  [phone]  Name: Stanley Bass MRN: [id number] Date of Birth: 07-Jul-1946

## 2017-10-21 ENCOUNTER — Ambulatory Visit (HOSPITAL_COMMUNITY): Payer: Medicare HMO

## 2017-10-21 ENCOUNTER — Encounter (HOSPITAL_COMMUNITY): Payer: Self-pay

## 2017-10-21 DIAGNOSIS — R2689 Other abnormalities of gait and mobility: Secondary | ICD-10-CM

## 2017-10-21 DIAGNOSIS — M545 Low back pain, unspecified: Secondary | ICD-10-CM

## 2017-10-21 DIAGNOSIS — R531 Weakness: Secondary | ICD-10-CM

## 2017-10-21 DIAGNOSIS — R269 Unspecified abnormalities of gait and mobility: Secondary | ICD-10-CM

## 2017-10-21 NOTE — Therapy (Signed)
[place], [place], [address] Phone: [phone]   Fax:  [phone]  Physical Therapy Treatment  Patient Details  Name: Stanley Bass MRN: [id number] Date of Birth: 03-Sep-1946 Referring Provider: [name], MD   Encounter Date: 10/21/2017  PT End of Session - 10/21/17 0938    Visit Number  6    Number of Visits  9    Date for PT Re-Evaluation  10/29/17    Authorization Type  [employer]na Medicare [facility]    Authorization Time Period  10/01/2017-10/29/2017    PT Start Time  0903    PT Stop Time  0945    PT Time Calculation (min)  42 min    Activity Tolerance  Patient tolerated treatment well;No increased pain    Behavior During Therapy  WFL for tasks assessed/performed       Past Medical History:  Diagnosis Date  . Prostate cancer ([place])   . Vertigo     Past Surgical History:  Procedure Laterality Date  . COLONOSCOPY N/A 05/08/2017   Procedure: COLONOSCOPY;  Surgeon: Daneil Dolin, MD;  [facility];  Service: Endoscopy;  Laterality: N/A;  1:00 pm  . PROSTATE SURGERY      There were no vitals filed for this visit.  Subjective Assessment - 10/21/17 0906    Subjective  Pt notes that he is feeling beter but still 3/10 pain.     Currently in Pain?  Yes    Pain Score  3                       [facility] Adult PT Treatment/Exercise - 10/21/17 0001      Lumbar Exercises: Stretches   Single Knee to Chest Stretch  5 reps;10 seconds    Prone on Elbows Stretch  10 seconds;Other (comment) 10 reps    Piriformis Stretch  2 reps;30 seconds    Piriformis Stretch Limitations  supine      Lumbar Exercises: Standing   Push / Pull Sled  3D hip strength on airex x 5 bilateral    Other Standing Lumbar Exercises  lateral step up with 4" box, 10 reps with Red tband at chest    Other Standing Lumbar Exercises  4" forward step up with theraband shoulder extension to faciliate lumbar spine extension, 10x  10 BLE      Lumbar Exercises: Sidelying   Clam  10 reps      Lumbar Exercises: Quadruped   [place]  5 reps    Other Quadruped Lumbar Exercises  child's pose 10" x 10       Manual Therapy   Manual Therapy  Soft tissue mobilization    Manual therapy comments  Manual complete separate than rest of tx    Soft tissue mobilization  Rt sidelying with pillow between knees; manual to gluteal musculature to reduce spasms/tightness               PT Short Term Goals - 10/01/17 1545      PT SHORT TERM GOAL #1   Title  Pt will be independent with HEP to improve pain and functional mobility.     Time  2    Period  Weeks    Status  New    Target Date  10/15/17      PT SHORT TERM GOAL #2   Title  Patient will have at most 3/10 pain throughout his day while  completing functional daily tasks and ambulating through the grocery store.     Time  2    Period  Weeks    Status  New      PT SHORT TERM GOAL #3   Title  Patient will have MMT grade improvement by 1 grade to improve hip stability and decrease stress on low back.     Time  2    Period  Weeks    Status  New        PT Long Term Goals - 10/01/17 1547      PT LONG TERM GOAL #1   Title  Patient will have improved MMT grade by 2 to improve functional strength and protection of low back during functional squatting activities to pick up object at home.     Time  4    Period  Weeks    Status  New      PT LONG TERM GOAL #2   Title  Patient will demonstrate indepedence with bed mobility with desired for to protect low back 3/5 trials.     Time  4    Period  Weeks    Status  New      PT LONG TERM GOAL #3   Title  Patient will demonstrate appropriate form to pick object off the floor with pain < 2/10 to decrease risk of reinjury in the future.     Time  4    Period  Weeks    Status  New      PT LONG TERM GOAL #4   Title  Patient will have improved FOTO score by 20% to indicate improved perception of functional  mobility without pain.     Time  4    Period  Weeks    Status  New            Plan - 10/21/17 0939    Clinical Impression Statement  Today's session focused initially on manual in right SL position to lower lumbar on Left side and glute. Continued significant tenderness to inferior medial to lateral glute with light to moderate pressure. Patient tolerated addition of stretches for quadriceps this session and prone on elbows stretch well. Pt required max assist for cat/cow lumbar mobility exercise in quadruped this visit. Pt continuesto benefit from skilled PT.     Rehab Potential  Good    Clinical Impairments Affecting Rehab Potential  (-) mechanism of injury, multiple pain sites; (+) previous active at gym     PT Frequency  2x / week    PT Duration  4 weeks    PT Treatment/Interventions  ADLs/Self Care Home Management;Electrical Stimulation;Cryotherapy;Gait training;Stair training;Functional mobility training;Therapeutic activities;Therapeutic exercise;Balance training;Neuromuscular re-education;Patient/family education;Manual techniques;Dry needling    PT Next Visit Plan  Focus initially on manual and core stability; postural education. Trial with poe next session.  Advance standing exercies for functional hip strengthening.     PT Home Exercise Plan  Eval: LTR, SKTC, lumbar roll education, seated hamstring stretch, prone quad stretch       Patient will benefit from skilled therapeutic intervention in order to improve the following deficits and impairments:  Abnormal gait, Decreased activity tolerance, Decreased strength, Pain, Difficulty walking, Decreased balance, Increased muscle spasms, Decreased range of motion, Decreased coordination, Impaired flexibility, Decreased mobility, Postural dysfunction, Improper body mechanics  Visit Diagnosis: Acute midline low back pain without sciatica  Decreased strength  Decreased mobility  Abnormality of gait     Problem List Patient  Active Problem List   Diagnosis Date Noted  . Prostate cancer ([place]) 09/15/2014  . KNEE, ARTHRITIS, DEGEN./OSTEO 10/02/2009  . SHORTNESS OF BREATH 01/16/2009    Starr [place] PT, DPT 9:45 AM, 10/21/17 [place], [place], [address] Phone: [phone]   Fax:  [phone]  Name: Stanley Bass MRN: [id number] Date of Birth: September 30, 1946

## 2017-10-23 ENCOUNTER — Ambulatory Visit (HOSPITAL_COMMUNITY): Payer: Medicare HMO | Admitting: Physical Therapy

## 2017-10-23 ENCOUNTER — Encounter (HOSPITAL_COMMUNITY): Payer: Self-pay | Admitting: Physical Therapy

## 2017-10-23 DIAGNOSIS — R531 Weakness: Secondary | ICD-10-CM

## 2017-10-23 DIAGNOSIS — M545 Low back pain, unspecified: Secondary | ICD-10-CM

## 2017-10-23 DIAGNOSIS — R2689 Other abnormalities of gait and mobility: Secondary | ICD-10-CM

## 2017-10-23 DIAGNOSIS — R269 Unspecified abnormalities of gait and mobility: Secondary | ICD-10-CM
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
## 2017-10-23 NOTE — Patient Instructions (Addendum)
Bent Leg Lift (Hook-Lying)    Tighten stomach and slowly raise right leg __3__ inches from floor. Keep trunk rigid. Hold __10__ seconds. Repeat _10___ times per set. Do __1__ sets per session. Do ___2_ sessions per day.  http://orth.exer.us/1090   Copyright  VHI. All rights reserved.  Bridging    Slowly raise buttocks from floor, keeping stomach tight.  Keep your hips level  Repeat _10___ times per set. Do __1__ sets per session. Do __2__ sessions per day.  http://orth.exer.us/1096   Copyright  VHI. All rights reserved.  Straight Leg Raise (Prone)    Abdomen and head supported, keep left knee locked and raise leg at hip. Avoid arching low back. Repeat __10__ times per set. Do _1___ sets per session. Do _2___ sessions per day.  http://orth.exer.us/1112   Copyright  VHI. All rights reserved.  Functional Quadriceps: Chair Squat    Keeping feet flat on floor, shoulder width apart, squat as low as is comfortable. Use support as necessary. Repeat _10___ times per set. Do _1___ sets per session. Do _2___ sessions per day. 10 http://orth.exer.us/736   Copyright  VHI. All rights reserved.

## 2017-11-04 DIAGNOSIS — M542 Cervicalgia: Secondary | ICD-10-CM | POA: Diagnosis not present

## 2017-11-04 DIAGNOSIS — M545 Low back pain: Secondary | ICD-10-CM | POA: Diagnosis not present

## 2017-11-06 ENCOUNTER — Telehealth (HOSPITAL_COMMUNITY): Payer: Self-pay | Admitting: Physical Therapy
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
## 2017-12-03 DIAGNOSIS — N5231 Erectile dysfunction following radical prostatectomy: Secondary | ICD-10-CM | POA: Diagnosis not present

## 2017-12-03 DIAGNOSIS — Z8546 Personal history of malignant neoplasm of prostate: Secondary | ICD-10-CM | POA: Diagnosis not present

## 2017-12-10 DIAGNOSIS — R69 Illness, unspecified: Secondary | ICD-10-CM | POA: Diagnosis not present
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
## 2017-12-15 DIAGNOSIS — R69 Illness, unspecified: Secondary | ICD-10-CM | POA: Diagnosis not present

## 2017-12-21 DIAGNOSIS — H6501 Acute serous otitis media, right ear: Secondary | ICD-10-CM | POA: Diagnosis not present

## 2017-12-21 DIAGNOSIS — H9201 Otalgia, right ear: Secondary | ICD-10-CM | POA: Diagnosis not present

## 2017-12-24 DIAGNOSIS — H9202 Otalgia, left ear: Secondary | ICD-10-CM | POA: Diagnosis not present

## 2018-01-07 DIAGNOSIS — E042 Nontoxic multinodular goiter: Secondary | ICD-10-CM | POA: Diagnosis not present

## 2018-01-07 DIAGNOSIS — I499 Cardiac arrhythmia, unspecified: Secondary | ICD-10-CM | POA: Diagnosis not present

## 2018-01-08 ENCOUNTER — Other Ambulatory Visit: Payer: Self-pay | Admitting: Internal Medicine

## 2018-01-08 DIAGNOSIS — E042 Nontoxic multinodular goiter: Secondary | ICD-10-CM

## 2018-01-13 ENCOUNTER — Other Ambulatory Visit: Payer: Medicare HMO

## 2018-01-20 ENCOUNTER — Other Ambulatory Visit (HOSPITAL_COMMUNITY)
Admission: RE | Admit: 2018-01-20 | Discharge: 2018-01-20 | Disposition: A | Discharge status: Home / Self Care | Payer: Medicare HMO | Source: Ambulatory Visit | Attending: Radiology | Admitting: Radiology

## 2018-01-20 ENCOUNTER — Ambulatory Visit
Admission: RE | Admit: 2018-01-20 | Discharge: 2018-01-20 | Disposition: A | Discharge status: Home / Self Care | Payer: Medicare HMO | Source: Ambulatory Visit | Attending: Internal Medicine | Admitting: Internal Medicine

## 2018-01-20 DIAGNOSIS — E042 Nontoxic multinodular goiter: Secondary | ICD-10-CM

## 2018-01-20 DIAGNOSIS — E041 Nontoxic single thyroid nodule: Secondary | ICD-10-CM | POA: Insufficient documentation

## 2018-04-13 DIAGNOSIS — J301 Allergic rhinitis due to pollen: Secondary | ICD-10-CM | POA: Diagnosis not present

## 2018-04-13 DIAGNOSIS — R7303 Prediabetes: Secondary | ICD-10-CM | POA: Diagnosis not present

## 2018-07-13 ENCOUNTER — Ambulatory Visit (INDEPENDENT_AMBULATORY_CARE_PROVIDER_SITE_OTHER): Payer: Medicare HMO | Admitting: Orthopaedic Surgery

## 2018-07-13 ENCOUNTER — Encounter: Payer: Self-pay | Admitting: Orthopaedic Surgery

## 2018-07-13 ENCOUNTER — Ambulatory Visit (INDEPENDENT_AMBULATORY_CARE_PROVIDER_SITE_OTHER): Payer: Medicare HMO

## 2018-07-13 VITALS — BP 117/64 | HR 68 | Ht 66.0 in | Wt 196.0 lb

## 2018-07-13 DIAGNOSIS — G8929 Other chronic pain: Secondary | ICD-10-CM | POA: Diagnosis not present

## 2018-07-13 DIAGNOSIS — M25561 Pain in right knee: Secondary | ICD-10-CM

## 2018-07-13 MED ORDER — NAPROXEN 500 MG PO TABS: 500.0000 mg | ORAL_TABLET | Freq: Two times a day (BID) | ORAL | 5 refills | Status: DC

## 2018-07-13 NOTE — Progress Notes (Signed)
Subjective:    Patient ID: Stanley Bass, male    DOB: 1946-09-17, 72 y.o.   MRN: [id number]  HPI He has pain in the right knee that is getting worse.  He has some swelling and popping.  He has no locking or giving way.  He had surgery on the knee in the past by Dr. Aline Brochure for a locking knee.  He did well for several years.  He plays a lot of golf and has pain if he plays two or more days in a row.  He has no trauma,no redness.  He is taking Tylenol.     Review of Systems  Constitutional: Positive for activity change.  Musculoskeletal: Positive for arthralgias, gait problem and joint swelling.  All other systems reviewed and are negative.  For Review of Systems, all other systems reviewed and are negative.  The following is a summary of the past history medically, past history surgically, known current medicines, social history and family history.  This information is gathered electronically by the computer from prior information and documentation.  I review this each visit and have found including this information at this point in the chart is beneficial and informative.   Past Medical History:  Diagnosis Date  . Prostate cancer ([place])   . Vertigo     Past Surgical History:  Procedure Laterality Date  . COLONOSCOPY N/A 05/08/2017   Procedure: COLONOSCOPY;  Surgeon: Daneil Dolin, MD;  [facility];  Service: Endoscopy;  Laterality: N/A;  1:00 pm  . PROSTATE SURGERY      Current Outpatient Medications on File Prior to Visit  Medication Sig Dispense Refill  . aspirin EC 81 MG tablet Take 81 mg by mouth daily.    Marland Kitchen HYDROcodone-acetaminophen (NORCO/VICODIN) 5-325 MG tablet 1 tab PO q8 hours prn pain 8 tablet 0  . Magnesium 500 MG TABS Take 500 mg by mouth daily.    . meclizine (ANTIVERT) 25 MG tablet Take 25 mg by mouth 3 (three) times daily as needed for dizziness.    . methocarbamol (ROBAXIN) 500 MG tablet Take 1 tablet (500 mg total) by mouth every 8 (eight) hours  as needed for muscle spasms. 15 tablet 0  . Multiple Vitamin (MULTIVITAMIN WITH MINERALS) TABS tablet Take 1 tablet by mouth daily. Men's 50+    . polyethylene glycol-electrolytes (TRILYTE) 420 g solution Take 4,000 mLs by mouth as directed. 4000 mL 0  . triamcinolone cream (KENALOG) 0.1 % Apply 1 application topically 2 (two) times daily as needed (for dry/irritated skin).    . vitamin B-12 (CYANOCOBALAMIN) 1000 MCG tablet Take 1,000 mcg by mouth daily.     No current facility-administered medications on file prior to visit.     Social History   Socioeconomic History  . Marital status: Married    Spouse name: Blanch Media  . Number of children: 3  . Years of education: HS  . Highest education level: Not on file  Occupational History  . Occupation: Retired    Fish farm manager: [employer]  Social Needs  . Financial resource strain: Not on file  . Food insecurity:    Worry: Not on file    Inability: Not on file  . Transportation needs:    Medical: Not on file    Non-medical: Not on file  Tobacco Use  . Smoking status: Never Smoker  . Smokeless tobacco: Never Used  Substance and Sexual Activity  . Alcohol use: No  . Drug use: No  . Sexual  activity: Not on file  Lifestyle  . Physical activity:    Days per week: Not on file    Minutes per session: Not on file  . Stress: Not on file  Relationships  . Social connections:    Talks on phone: Not on file    Gets together: Not on file    Attends religious service: Not on file    Active member of club or organization: Not on file    Attends meetings of clubs or organizations: Not on file    Relationship status: Not on file  . Intimate partner violence:    Fear of current or ex partner: Not on file    Emotionally abused: Not on file    Physically abused: Not on file    Forced sexual activity: Not on file  Other Topics Concern  . Not on file  Social History Narrative   Patient lives at home with his spouse.   Caffeine Use: 1-2 cups daily     Family History  Problem Relation Age of Onset  . Diabetes Mother   . Stroke Father   . Heart attack Brother     BP 117/64   Pulse 68   Ht 5\' 6"  (1.676 m)   Wt 196 lb (88.9 kg)   BMI 31.64 kg/m   Body mass index is 31.64 kg/m.      Objective:   Physical Exam  Constitutional: He is oriented to person, place, and time. He appears well-developed and well-nourished.  HENT:  Head: Normocephalic and atraumatic.  Eyes: Pupils are equal, round, and reactive to light. Conjunctivae and EOM are normal.  Neck: Normal range of motion. Neck supple.  Cardiovascular: Normal rate, regular rhythm and intact distal pulses.  Pulmonary/Chest: Effort normal.  Abdominal: Soft.  Musculoskeletal:       Right knee: He exhibits decreased range of motion. Tenderness found. Medial joint line tenderness noted.       Legs: Neurological: He is alert and oriented to person, place, and time. He has normal reflexes. He displays normal reflexes. No cranial nerve deficit. He exhibits normal muscle tone. Coordination normal.  Skin: Skin is warm and dry.  Psychiatric: He has a normal mood and affect. His behavior is normal. Judgment and thought content normal.    X-rays were done of the right knee, reported separately.     Assessment & Plan:   Encounter Diagnosis  Name Primary?  . Chronic pain of right knee Yes   PROCEDURE NOTE:  The patient requests injections of the right knee , verbal consent was obtained.  The right knee was prepped appropriately after time out was performed.   Sterile technique was observed and injection of 1 cc of Depo-Medrol 40 mg with several cc's of plain xylocaine. Anesthesia was provided by ethyl chloride and a 20-gauge needle was used to inject the knee area. The injection was tolerated well.  A band aid dressing was applied.  The patient was advised to apply ice later today and tomorrow to the injection sight as needed.  I will begin Naprosyn 500 po bid pc.  Call  if any problem.  Precautions discussed.   Return in three weeks.  Electronically Signed Sanjuana Kava, MD 8/20/20198:54 AM

## 2018-08-03 ENCOUNTER — Encounter: Payer: Self-pay | Admitting: Orthopaedic Surgery

## 2018-08-03 ENCOUNTER — Ambulatory Visit (INDEPENDENT_AMBULATORY_CARE_PROVIDER_SITE_OTHER): Payer: Medicare HMO | Admitting: Orthopaedic Surgery

## 2018-08-03 VITALS — BP 123/74 | HR 71 | Ht 66.0 in | Wt 192.0 lb

## 2018-08-03 DIAGNOSIS — M25561 Pain in right knee: Secondary | ICD-10-CM

## 2018-08-03 DIAGNOSIS — G8929 Other chronic pain: Secondary | ICD-10-CM

## 2018-08-03 NOTE — Progress Notes (Signed)
Patient QQ:VZDGLO GIUSEPPE DUCHEMIN, male DOB:1946-06-25, 72 y.o. VFI:[id number]  Chief Complaint  Patient presents with  . Knee Pain    right / better     HPI  Stanley Bass is a 72 y.o. male who has chronic pain of the right knee.  He is better with the Naprosyn.  He has good and bad days but more good days now.  He has no giving way or new trauma.  He has swelling and popping.   Body mass index is 30.99 kg/m.  ROS  Review of Systems  Constitutional: Positive for activity change.  Musculoskeletal: Positive for arthralgias, gait problem and joint swelling.  All other systems reviewed and are negative.   All other systems reviewed and are negative.  The following is a summary of the past history medically, past history surgically, known current medicines, social history and family history.  This information is gathered electronically by the computer from prior information and documentation.  I review this each visit and have found including this information at this point in the chart is beneficial and informative.    Past Medical History:  Diagnosis Date  . Prostate cancer ([place])   . Vertigo     Past Surgical History:  Procedure Laterality Date  . COLONOSCOPY N/A 05/08/2017   Procedure: COLONOSCOPY;  Surgeon: Daneil Dolin, MD;  [facility];  Service: Endoscopy;  Laterality: N/A;  1:00 pm  . PROSTATE SURGERY      Family History  Problem Relation Age of Onset  . Diabetes Mother   . Stroke Father   . Heart attack Brother     Social History Social History   Tobacco Use  . Smoking status: Never Smoker  . Smokeless tobacco: Never Used  Substance Use Topics  . Alcohol use: No  . Drug use: No    Allergies  Allergen Reactions  . Shellfish-Derived Products Hives  . Penicillins Rash    Has patient had a PCN reaction causing immediate rash, facial/tongue/throat swelling, SOB or lightheadedness with hypotension: Unknown Has patient had a PCN reaction causing  severe rash involving mucus membranes or skin necrosis:Unknown Has patient had a PCN reaction that required hospitalization: No Has patient had a PCN reaction occurring within the last 10 years: No If all of the above answers are "NO", then may proceed with Cephalosporin use.     Current Outpatient Medications  Medication Sig Dispense Refill  . aspirin EC 81 MG tablet Take 81 mg by mouth daily.    Marland Kitchen HYDROcodone-acetaminophen (NORCO/VICODIN) 5-325 MG tablet 1 tab PO q8 hours prn pain 8 tablet 0  . Magnesium 500 MG TABS Take 500 mg by mouth daily.    . meclizine (ANTIVERT) 25 MG tablet Take 25 mg by mouth 3 (three) times daily as needed for dizziness.    . methocarbamol (ROBAXIN) 500 MG tablet Take 1 tablet (500 mg total) by mouth every 8 (eight) hours as needed for muscle spasms. 15 tablet 0  . Multiple Vitamin (MULTIVITAMIN WITH MINERALS) TABS tablet Take 1 tablet by mouth daily. Men's 50+    . naproxen (NAPROSYN) 500 MG tablet Take 1 tablet (500 mg total) by mouth 2 (two) times daily with a meal. 60 tablet 5  . polyethylene glycol-electrolytes (TRILYTE) 420 g solution Take 4,000 mLs by mouth as directed. 4000 mL 0  . triamcinolone cream (KENALOG) 0.1 % Apply 1 application topically 2 (two) times daily as needed (for dry/irritated skin).    . vitamin B-12 (CYANOCOBALAMIN)  1000 MCG tablet Take 1,000 mcg by mouth daily.     No current facility-administered medications for this visit.      Physical Exam  Blood pressure 123/74, pulse 71, height 5\' 6"  (1.676 m), weight 192 lb (87.1 kg).  Constitutional: overall normal hygiene, normal nutrition, well developed, normal grooming, normal body habitus. Assistive device:none  Musculoskeletal: gait and station Limp right, muscle tone and strength are normal, no tremors or atrophy is present.  .  Neurological: coordination overall normal.  Deep tendon reflex/nerve stretch intact.  Sensation normal.  Cranial nerves II-XII intact.   Skin:    Normal overall no scars, lesions, ulcers or rashes. No psoriasis.  Psychiatric: Alert and oriented x 3.  Recent memory intact, remote memory unclear.  Normal mood and affect. Well groomed.  Good eye contact.  Cardiovascular: overall no swelling, no varicosities, no edema bilaterally, normal temperatures of the legs and arms, no clubbing, cyanosis and good capillary refill.  Lymphatic: palpation is normal.  Right knee has ROM of 0 to 110, mild effusion, pain medially, stable, has crepitus, slight limp to the right, NV intact.  All other systems reviewed and are negative   The patient has been educated about the nature of the problem(s) and counseled on treatment options.  The patient appeared to understand what I have discussed and is in agreement with it.  Encounter Diagnosis  Name Primary?  . Chronic pain of right knee Yes    PLAN Call if any problems.  Precautions discussed.  Continue current medications.   Return to clinic 6 weeks   Electronically Signed Sanjuana Kava, MD 9/10/20198:45 AM

## 2018-08-10 DIAGNOSIS — Z6829 Body mass index (BMI) 29.0-29.9, adult: Secondary | ICD-10-CM | POA: Diagnosis not present

## 2018-08-10 DIAGNOSIS — R7303 Prediabetes: Secondary | ICD-10-CM | POA: Diagnosis not present

## 2018-08-11 DIAGNOSIS — R69 Illness, unspecified: Secondary | ICD-10-CM | POA: Diagnosis not present

## 2018-08-14 DIAGNOSIS — R69 Illness, unspecified: Secondary | ICD-10-CM | POA: Diagnosis not present

## 2018-08-30 DIAGNOSIS — R69 Illness, unspecified: Secondary | ICD-10-CM | POA: Diagnosis not present

## 2018-09-08 ENCOUNTER — Other Ambulatory Visit: Payer: Self-pay

## 2018-09-08 ENCOUNTER — Encounter (HOSPITAL_COMMUNITY): Payer: Self-pay | Admitting: Physical Therapy

## 2018-09-08 ENCOUNTER — Ambulatory Visit (HOSPITAL_COMMUNITY): Payer: MEDICARE | Attending: Family Medicine | Admitting: Physical Therapy

## 2018-09-08 DIAGNOSIS — M542 Cervicalgia: Secondary | ICD-10-CM | POA: Diagnosis not present

## 2018-09-08 DIAGNOSIS — M5412 Radiculopathy, cervical region: Secondary | ICD-10-CM | POA: Diagnosis not present
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
## 2018-09-08 NOTE — Patient Instructions (Addendum)
Scapular Retraction (Standing)   May do sitting up tall  With arms at sides, pinch shoulder blades together. Repeat 101____ times per set. Do ____ sets per session. Do ___2_ sessions per day.  http://orth.exer.us/944   Copyright  VHI. All rights reserved.  Flexibility: Neck Retraction    Pull head straight back, keeping eyes and jaw level. Repeat __10__ times per set. Do _1___ sets per session. Do ____2 sessions per day.  http://orth.exer.us/344   Copyright  VHI. All rights reserved.  AROM: Lateral Neck Flexion    Slowly tilt head toward one shoulder, then the other. Hold each position __5__ seconds. Repeat _10___ times per set. Do __1__ sets per session. Do __3__ sessions per day.  http://orth.exer.us/296   Copyright  VHI. All rights reserved.  AROM: Neck Rotation    Turn head slowly to look over one shoulder, then the other. Hold each position ___5_ seconds. Repeat __10__ times per set. Do _1___ sets per session. Do __3__ sessions per day.  http://orth.exer.us/294   Copyright  VHI. All rights reserved.

## 2018-09-09 ENCOUNTER — Ambulatory Visit: Payer: Medicare HMO | Admitting: Orthopaedic Surgery

## 2018-09-10 ENCOUNTER — Encounter (HOSPITAL_COMMUNITY): Payer: Self-pay | Admitting: Physical Therapy

## 2018-09-10 ENCOUNTER — Encounter (HOSPITAL_COMMUNITY): Payer: Self-pay

## 2018-09-10 ENCOUNTER — Ambulatory Visit (HOSPITAL_COMMUNITY): Payer: MEDICARE | Admitting: Physical Therapy

## 2018-09-10 ENCOUNTER — Telehealth (HOSPITAL_COMMUNITY): Payer: Self-pay | Admitting: Physical Therapy

## 2018-09-10 NOTE — Telephone Encounter (Signed)
Patient came in for rehab and needed to leave he had a emergency.

## 2018-09-10 NOTE — Therapy (Signed)
[place], [place], [address] Phone: [phone]   Fax:  [phone]  Patient Details  Name: Stanley Bass MRN: [id number] Date of Birth: [birth year] Referring Provider:  [name], MD  Encounter Date: 09/10/2018   PT came to visit and signed in but prior to treatment starting had an emergency and had to leave.  Rayetta Humphrey, PT CLT [phone] 09/10/2018, 9:53 AM  [place], [place], [address] Phone: [phone]   Fax:  [phone]

## 2018-09-14 ENCOUNTER — Ambulatory Visit: Payer: Medicare HMO | Admitting: Orthopaedic Surgery

## 2018-09-14 ENCOUNTER — Ambulatory Visit (HOSPITAL_COMMUNITY): Payer: MEDICARE

## 2018-09-14 ENCOUNTER — Encounter (HOSPITAL_COMMUNITY): Payer: Self-pay

## 2018-09-14 ENCOUNTER — Other Ambulatory Visit: Payer: Self-pay

## 2018-09-14 DIAGNOSIS — M5412 Radiculopathy, cervical region: Secondary | ICD-10-CM

## 2018-09-14 DIAGNOSIS — M542 Cervicalgia: Secondary | ICD-10-CM | POA: Diagnosis not present
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
## 2018-09-15 ENCOUNTER — Ambulatory Visit (HOSPITAL_COMMUNITY): Payer: MEDICARE | Admitting: Physical Therapy

## 2018-09-15 DIAGNOSIS — M542 Cervicalgia: Secondary | ICD-10-CM | POA: Diagnosis not present

## 2018-09-15 DIAGNOSIS — M5412 Radiculopathy, cervical region: Secondary | ICD-10-CM | POA: Diagnosis not present
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
## 2018-09-21 ENCOUNTER — Ambulatory Visit (HOSPITAL_COMMUNITY): Payer: MEDICARE | Admitting: Physical Therapy

## 2018-09-21 DIAGNOSIS — M5412 Radiculopathy, cervical region: Secondary | ICD-10-CM | POA: Diagnosis not present

## 2018-09-21 DIAGNOSIS — M542 Cervicalgia: Secondary | ICD-10-CM | POA: Diagnosis not present
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
## 2018-09-23 ENCOUNTER — Ambulatory Visit (HOSPITAL_COMMUNITY): Payer: MEDICARE | Admitting: Physical Therapy

## 2018-09-23 DIAGNOSIS — M542 Cervicalgia: Secondary | ICD-10-CM | POA: Diagnosis not present

## 2018-09-23 DIAGNOSIS — M5412 Radiculopathy, cervical region: Secondary | ICD-10-CM | POA: Diagnosis not present

## 2018-09-23 NOTE — Therapy (Signed)
[place], [place], [address] Phone: [phone]   Fax:  [phone]  Physical Therapy Treatment  Patient Details  Name: Stanley Bass MRN: [id number] Date of Birth: Jul 21, 1946 Referring Provider (PT): [name]    Encounter Date: 09/23/2018  PT End of Session - 09/23/18 1715    Visit Number  5    Number of Visits  12    Date for PT Re-Evaluation  10/08/18    Authorization Type  UHC    Authorization - Visit Number  5    Authorization - Number of Visits  12    PT Start Time  1500    PT Stop Time  1545    PT Time Calculation (min)  45 min    Activity Tolerance  Patient tolerated treatment well    Behavior During Therapy  [facility] for tasks assessed/performed       Past Medical History:  Diagnosis Date  . Prostate cancer ([place])   . Vertigo     Past Surgical History:  Procedure Laterality Date  . COLONOSCOPY N/A 05/08/2017   Procedure: COLONOSCOPY;  Surgeon: Daneil Dolin, MD;  [facility];  Service: Endoscopy;  Laterality: N/A;  1:00 pm  . PROSTATE SURGERY      There were no vitals filed for this visit.  Subjective Assessment - 09/23/18 1726    Subjective  Pt states he is feeling a little better but still having pain in the same spot.     Currently in Pain?  Yes    Pain Score  6     Pain Location  Neck    Pain Orientation  Lower    Pain Descriptors / Indicators  Aching    Pain Type  Chronic pain                       [facility] Adult PT Treatment/Exercise - 09/23/18 0001      Neck Exercises: Seated   Neck Retraction  10 reps    W Back  15 reps;Limitations    W Back Weights (lbs)  3 sec holds    Shoulder Rolls  Backwards;10 reps    Other Seated Exercise  cervical and thoracic 3 D excursions       Neck Exercises: Supine   Neck Retraction  15 reps;5 secs    Cervical Rotation  10 reps;Both;Limitations      Manual Therapy   Manual Therapy  Soft tissue  mobilization;Manual Traction    Manual therapy comments  Manual complete separate than rest of tx    Soft tissue mobilization  seated to tight musculature in Lt UT    Manual Traction  4x 30"               PT Short Term Goals - 09/21/18 0909      PT SHORT TERM GOAL #1   Title  Pt to be I in HEP to allow pt to regain cervical rom for B rotation to 50 degrees to be able to have conversations with individuals sitting next to him without increased painl    Time  2    Period  Weeks    Status  Achieved      PT SHORT TERM GOAL #2   Title  Pt Pain level to decrease to no greater than 3/10 to allow tp to lift objects 3# or less to shoulder height without difficulty.  Time  2    Period  Weeks    Status  On-going      PT SHORT TERM GOAL #3   Title  PT to be more aware of proper posture in order to decrease stress on cervical spine.     Time  2    Period  Weeks    Status  On-going        PT Long Term Goals - 09/14/18 1132      PT LONG TERM GOAL #1   Title  Pt cervical rotation  to be increased to 60 degrees B  to allow safer driving     Time  4    Period  Weeks    Status  On-going      PT LONG TERM GOAL #2   Title  Pt cervical pain to be no greater than a 1/10 to allow pt to lift 5# or less to above shoulder height without difficulty     Time  4    Period  Weeks    Status  On-going      PT LONG TERM GOAL #3   Title  PT to have no radicular sx to Lt shoulder to demonstrate decreased nerve irritation.     Time  4    Period  Weeks    Status  On-going            Plan - 09/23/18 1716    Clinical Impression Statement  Contineud with postural strengthening and spinal ROM exericses. Most tightness in Lt UT with one palpable spasm resolved with manual technques.  Pt reported overall reduction in symptoms following manual techniques.  began sidelying lateral flexion with cues to complete in correct form.     Rehab Potential  Good    PT Frequency  3x / week    PT  Duration  4 weeks    PT Treatment/Interventions  ADLs/Self Care Home Management;Ultrasound;Traction;Moist Heat;Therapeutic activities;Therapeutic exercise;Patient/family education;Manual techniques;Passive range of motion;Dry needling    PT Next Visit Plan  contiue to progress postural strength while reducing spasm and pain.  REsume theraband exercises next session.    PT Home Exercise Plan  sitting tall, scapular and cervical retraction, cervical rotation and sidebend.     Consulted and Agree with Plan of Care  Patient       Patient will benefit from skilled therapeutic intervention in order to improve the following deficits and impairments:  Decreased activity tolerance, Decreased range of motion, Decreased strength, Increased muscle spasms, Impaired flexibility, Postural dysfunction, Pain  Visit Diagnosis: Radiculopathy, cervical region  Cervical pain     Problem List Patient Active Problem List   Diagnosis Date Noted  . Prostate cancer ([place]) 09/15/2014  . KNEE, ARTHRITIS, DEGEN./OSTEO 10/02/2009  . SHORTNESS OF BREATH 01/16/2009   Stanley Bass, PTA/CLT [phone]  Stanley Bass 09/23/2018, 5:27 PM  [place] [address] [place], [place], [address] Phone: [phone]   Fax:  [phone]  Name: Stanley Bass MRN: [id number] Date of Birth: [birth year]

## 2018-09-28 ENCOUNTER — Encounter (HOSPITAL_COMMUNITY): Payer: Self-pay | Admitting: Physical Therapy

## 2018-09-28 ENCOUNTER — Ambulatory Visit (HOSPITAL_COMMUNITY): Payer: MEDICARE | Attending: Family Medicine | Admitting: Physical Therapy

## 2018-09-28 ENCOUNTER — Other Ambulatory Visit: Payer: Self-pay

## 2018-09-28 DIAGNOSIS — M5412 Radiculopathy, cervical region: Secondary | ICD-10-CM | POA: Diagnosis not present

## 2018-09-28 DIAGNOSIS — M542 Cervicalgia: Secondary | ICD-10-CM | POA: Insufficient documentation
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
## 2018-09-28 NOTE — Patient Instructions (Addendum)
Flexibility: Neck Stretch    Grasp left arm above wrist and pull down across body while gently tilting head same direction. Hold _30___ seconds. Relax. Repeat __3__ times per set. Do _1___ sets per session. Do __2__ sessions per day.  http://orth.exer.us/346   Copyright  VHI. All rights reserved.  ROM: Flexion - Wand (Supine)    Lie on back holding wand. Raise arms over head.  Repeat _10___ times per set. Do __1__ sets per session. Do __1__ sessions per day.  http://orth.exer.us/928   Copyright  VHI. All rights reserved.  ROM: Abduction - Wand    Holding wand with left hand palm up, push wand directly out to side, leading with other hand palm down, until stretch is felt. Hold _5___ seconds. Repeat __10__ times per set. Do _1___ sets per session. Do ____ sessions per day.  http://orth.exer.us/746   Copyright  VHI. All rights reserved.  ROM: External / Internal Rotation - Wand    Holding a can with the left hand , rotate your hand to the pillow then to your hip. Keep elbow bent. When stretch is felt, hold __3__ seconds. Repeat to other side, leading with same hand. Keep elbows bent. Repeat 10____ times per set. Do __1__ sets per session. Do _2___ sessions per day.  http://orth.exer.us/748   Copyright  VHI. All rights reserved.

## 2018-09-30 ENCOUNTER — Ambulatory Visit (HOSPITAL_COMMUNITY): Payer: MEDICARE | Admitting: Physical Therapy

## 2018-09-30 DIAGNOSIS — M542 Cervicalgia: Secondary | ICD-10-CM | POA: Diagnosis not present

## 2018-09-30 DIAGNOSIS — M5412 Radiculopathy, cervical region: Secondary | ICD-10-CM

## 2018-09-30 NOTE — Therapy (Signed)
[place], [place], [address] Phone: [phone]   Fax:  [phone]  Physical Therapy Treatment  Patient Details  Name: Stanley Bass MRN: [id number] Date of Birth: 1946-09-29 Referring Provider (PT): [name]    Encounter Date: 09/30/2018  PT End of Session - 09/30/18 0842    Visit Number  7    Number of Visits  12    Date for PT Re-Evaluation  10/08/18    Authorization Type  UHC    Authorization - Visit Number  7    Authorization - Number of Visits  12    PT Start Time  0824    PT Stop Time  0904    PT Time Calculation (min)  40 min    Activity Tolerance  Patient tolerated treatment well    Behavior During Therapy  [facility] for tasks assessed/performed       Past Medical History:  Diagnosis Date  . Prostate cancer ([place])   . Vertigo     Past Surgical History:  Procedure Laterality Date  . COLONOSCOPY N/A 05/08/2017   Procedure: COLONOSCOPY;  Surgeon: Daneil Dolin, MD;  [facility];  Service: Endoscopy;  Laterality: N/A;  1:00 pm  . PROSTATE SURGERY      There were no vitals filed for this visit.  Subjective Assessment - 09/30/18 0828    Subjective  Pt states pain is 4/10 currently.    Currently in Pain?  Yes    Pain Score  4     Pain Location  Neck    Pain Orientation  Left    Pain Descriptors / Indicators  Aching                       [facility] Adult PT Treatment/Exercise - 09/30/18 0001      Exercises   Exercises  Neck      Neck Exercises: Theraband   Scapula Retraction  15 reps;Green    Shoulder Extension  15 reps;Green    Rows  15 reps;Green      Neck Exercises: Standing   Wall Push Ups  15 reps    Upper Extremity Flexion with Stabilization  10 reps    UE Flexion with Stabilization Limitations  against wall with neutral cervical.  limited Lt UE flexion due to pain    Other Standing Exercises  wall arch, 10 reps (pt stopped with Lt UE on rep 8 due to  pain)      Neck Exercises: Seated   Neck Retraction  10 reps    W Back  15 reps;Limitations    W Back Weights (lbs)  3 sec holds    Shoulder Rolls  Backwards;10 reps      Neck Exercises: Prone   Axial Exension  10 reps    Shoulder Extension  10 reps    Rows  10 reps      Manual Therapy   Manual Therapy  Soft tissue mobilization    Manual therapy comments  Manual complete separate than rest of tx    Soft tissue mobilization  prone to tight mm                PT Short Term Goals - 09/28/18 0844      PT SHORT TERM GOAL #1   Title  Pt to be I in HEP to allow pt to regain cervical rom for B rotation to 50 degrees to be  able to have conversations with individuals sitting next to him without increased painl    Time  2    Period  Weeks    Status  Achieved      PT SHORT TERM GOAL #2   Title  Pt Pain level to decrease to no greater than 3/10 to allow tp to lift objects 3# or less to shoulder height without difficulty.      Time  2    Period  Weeks    Status  On-going      PT SHORT TERM GOAL #3   Title  PT to be more aware of proper posture in order to decrease stress on cervical spine.     Time  2    Period  Weeks    Status  Achieved        PT Long Term Goals - 09/28/18 0844      PT LONG TERM GOAL #1   Title  Pt cervical rotation  to be increased to 60 degrees B  to allow safer driving     Time  4    Period  Weeks    Status  Partially Met      PT LONG TERM GOAL #2   Title  Pt cervical pain to be no greater than a 1/10 to allow pt to lift 5# or less to above shoulder height without difficulty     Time  4    Period  Weeks    Status  On-going      PT LONG TERM GOAL #3   Title  PT to have no radicular sx to Lt shoulder to demonstrate decreased nerve irritation.     Time  4    Period  Weeks    Status  On-going            Plan - 09/30/18 1610    Clinical Impression Statement  continued with postural and UE strengthening.  Pt continues to require max cues  to initiate and complete therex correctly, poor recall of existing exercises.  Overall reports of pain remaining the same but ROM improved since beginning therapy.  Limited by Lt shoulder pain as well.  Manual completed in prone reveals extremely tight Lt scapular region, paraspinal and upper trap.  Multiple spasms in Lt UT reduced but not resolved with manual.  Also noted highter Rt scapular in prone lying position.      Rehab Potential  Good    PT Frequency  3x / week    PT Duration  4 weeks    PT Treatment/Interventions  ADLs/Self Care Home Management;Ultrasound;Traction;Moist Heat;Therapeutic activities;Therapeutic exercise;Patient/family education;Manual techniques;Passive range of motion;Dry needling    PT Next Visit Plan  contiue to progress postural strength while reducing spasm and pain.  Give t-band for HEP when able to recall with less cues.     PT Home Exercise Plan  sitting tall, scapular and cervical retraction, cervical rotation and sidebend.     Consulted and Agree with Plan of Care  Patient       Patient will benefit from skilled therapeutic intervention in order to improve the following deficits and impairments:  Decreased activity tolerance, Decreased range of motion, Decreased strength, Increased muscle spasms, Impaired flexibility, Postural dysfunction, Pain  Visit Diagnosis: Radiculopathy, cervical region  Cervical pain     Problem List Patient Active Problem List   Diagnosis Date Noted  . Prostate cancer ([place]) 09/15/2014  . KNEE, ARTHRITIS, DEGEN./OSTEO 10/02/2009  . SHORTNESS OF BREATH  01/16/2009   Teena Irani, PTA/CLT [phone]  Teena Irani 09/30/2018, 9:12 AM  [place], [place], [address] Phone: [phone]   Fax:  [phone]  Name: Stanley Bass MRN: [id number] Date of Birth: 06-30-1946

## 2018-10-05 ENCOUNTER — Ambulatory Visit (HOSPITAL_COMMUNITY): Payer: MEDICARE | Admitting: Physical Therapy

## 2018-10-05 DIAGNOSIS — M542 Cervicalgia: Secondary | ICD-10-CM | POA: Diagnosis not present

## 2018-10-05 DIAGNOSIS — M5412 Radiculopathy, cervical region: Secondary | ICD-10-CM | POA: Diagnosis not present
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
## 2018-10-07 ENCOUNTER — Ambulatory Visit (HOSPITAL_COMMUNITY): Payer: MEDICARE | Admitting: Physical Therapy

## 2018-10-07 ENCOUNTER — Encounter (HOSPITAL_COMMUNITY): Payer: Self-pay | Admitting: Physical Therapy

## 2018-10-07 DIAGNOSIS — M5412 Radiculopathy, cervical region: Secondary | ICD-10-CM

## 2018-10-07 DIAGNOSIS — M542 Cervicalgia: Secondary | ICD-10-CM
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
## 2018-10-12 ENCOUNTER — Ambulatory Visit (HOSPITAL_COMMUNITY): Payer: MEDICARE | Admitting: Physical Therapy

## 2018-10-14 ENCOUNTER — Ambulatory Visit (HOSPITAL_COMMUNITY): Payer: 59 | Admitting: Physical Therapy

## 2018-11-09 DIAGNOSIS — Z0000 Encounter for general adult medical examination without abnormal findings: Secondary | ICD-10-CM | POA: Diagnosis not present

## 2018-11-09 DIAGNOSIS — Z6829 Body mass index (BMI) 29.0-29.9, adult: Secondary | ICD-10-CM | POA: Diagnosis not present

## 2018-11-09 DIAGNOSIS — R7309 Other abnormal glucose: Secondary | ICD-10-CM | POA: Diagnosis not present

## 2018-11-09 DIAGNOSIS — R7303 Prediabetes: Secondary | ICD-10-CM | POA: Diagnosis not present

## 2019-01-24 DIAGNOSIS — S0300XA Dislocation of jaw, unspecified side, initial encounter: Secondary | ICD-10-CM | POA: Insufficient documentation

## 2019-08-19 DIAGNOSIS — R69 Illness, unspecified: Secondary | ICD-10-CM | POA: Diagnosis not present

## 2019-08-23 DIAGNOSIS — Z6828 Body mass index (BMI) 28.0-28.9, adult: Secondary | ICD-10-CM | POA: Diagnosis not present

## 2019-08-23 DIAGNOSIS — R7303 Prediabetes: Secondary | ICD-10-CM | POA: Diagnosis not present

## 2019-08-31 DIAGNOSIS — R69 Illness, unspecified: Secondary | ICD-10-CM | POA: Diagnosis not present

## 2019-11-02 DIAGNOSIS — R69 Illness, unspecified: Secondary | ICD-10-CM | POA: Diagnosis not present

## 2019-11-12 IMAGING — US US THYROID
1 series · 12 of 25 positions shown · non-contrast
Comparison: 09/18/2017

CLINICAL DATA: Nodules by CT

EXAM:
THYROID ULTRASOUND
TECHNIQUE: Ultrasound examination of the thyroid gland and adjacent soft
tissues was performed.

[Series 1: us thyroid · 0.07mm/px · 12 of 56 slices shown]
[im 3/56]
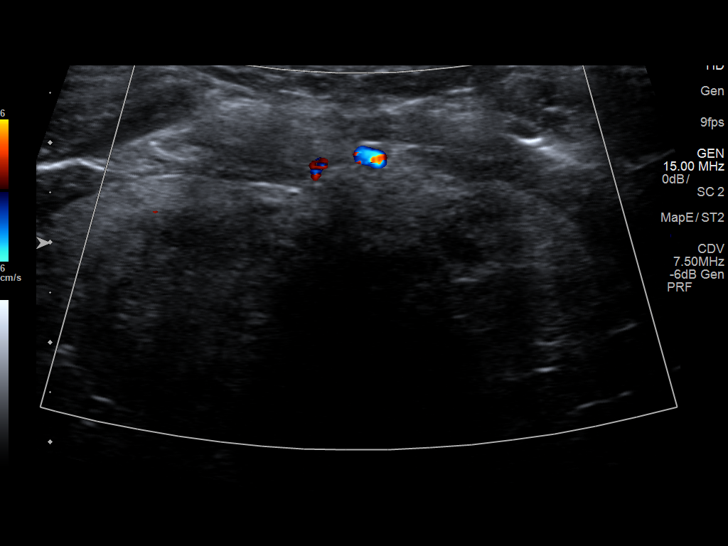
[im 7/56]
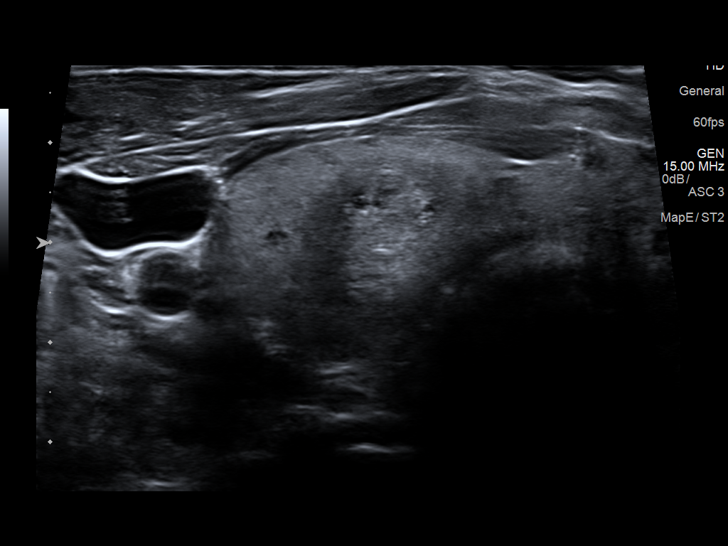
[im 12/56]
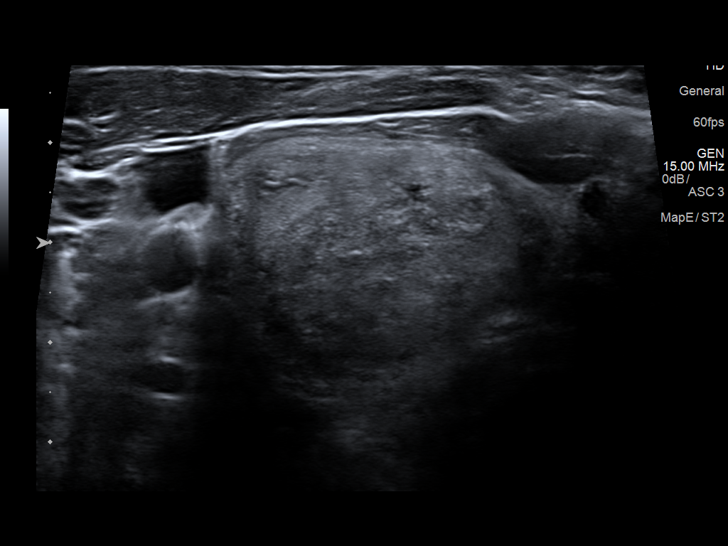
[im 17/56]
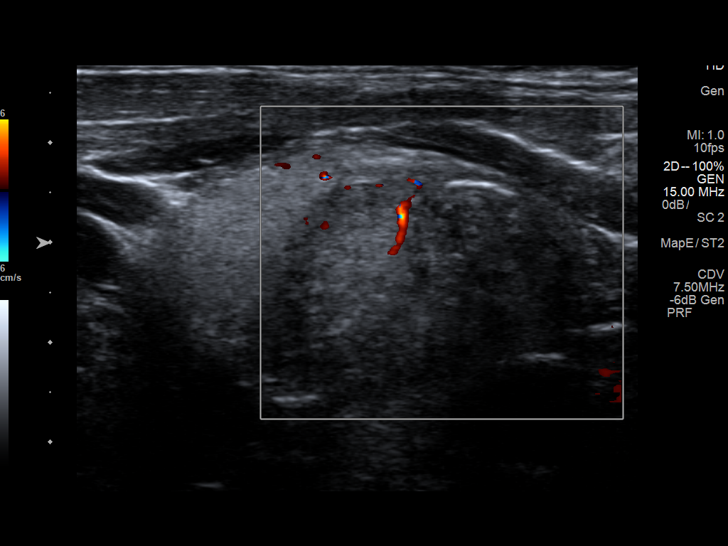
[im 21/56]
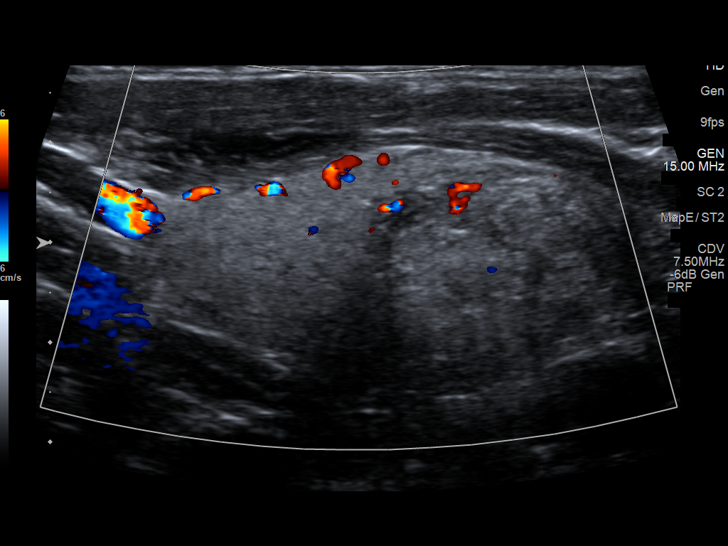
[im 26/56]
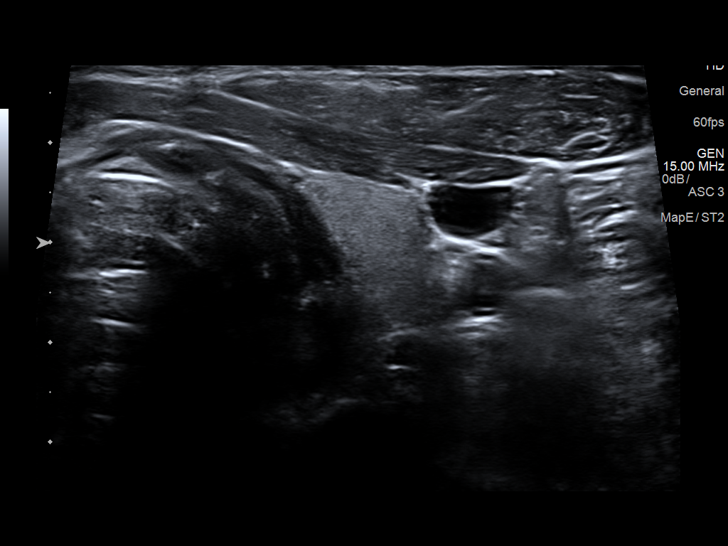
[im 30/56]
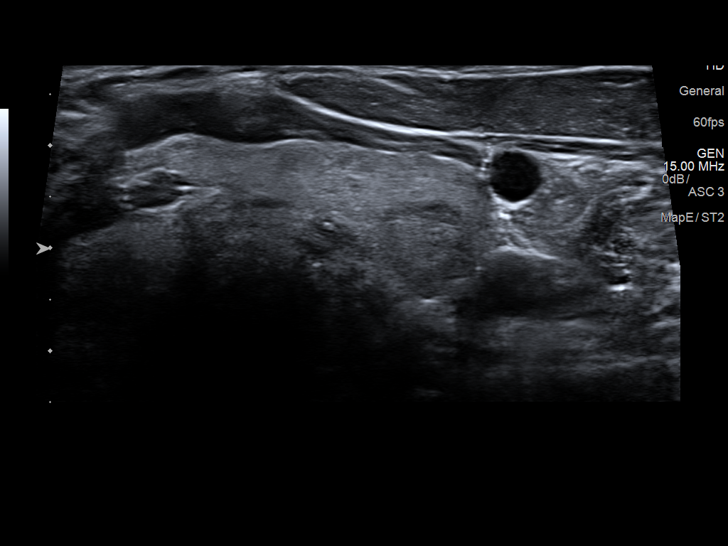
[im 35/56]
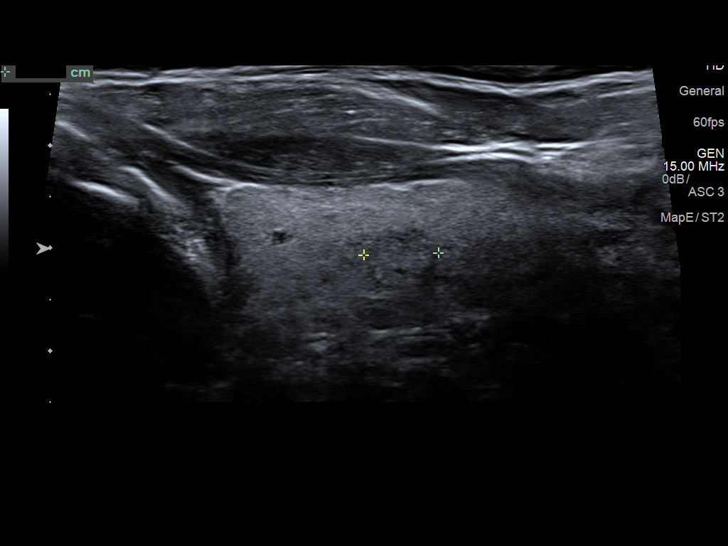
[im 39/56]
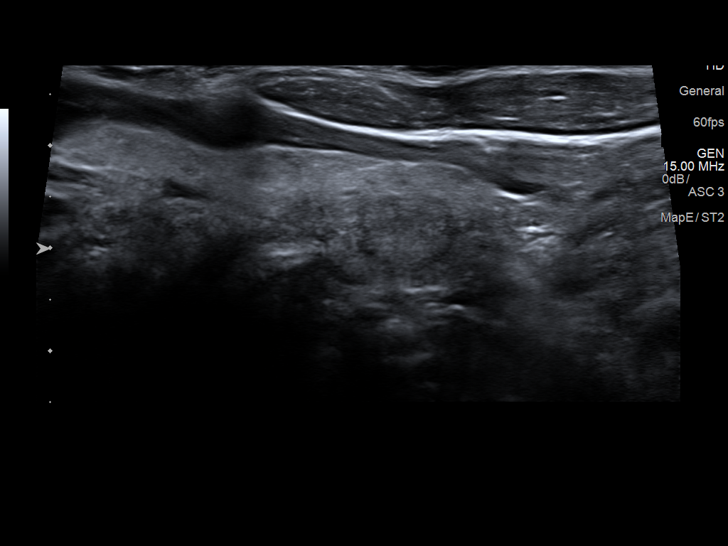
[im 44/56]
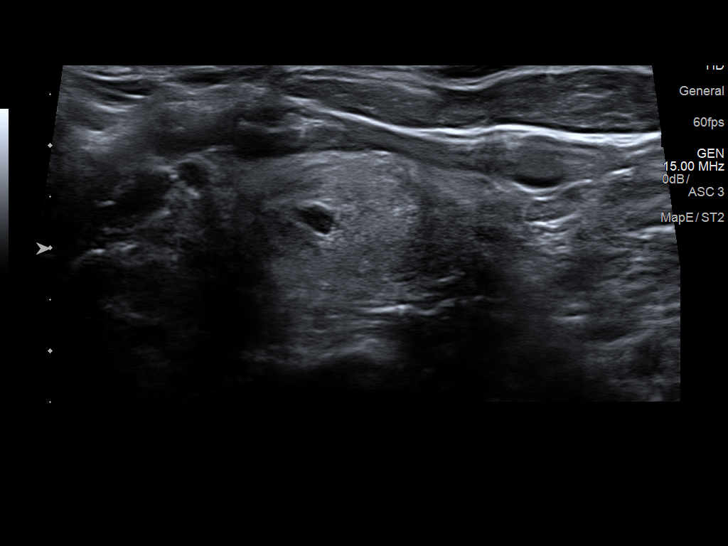
[im 49/56]
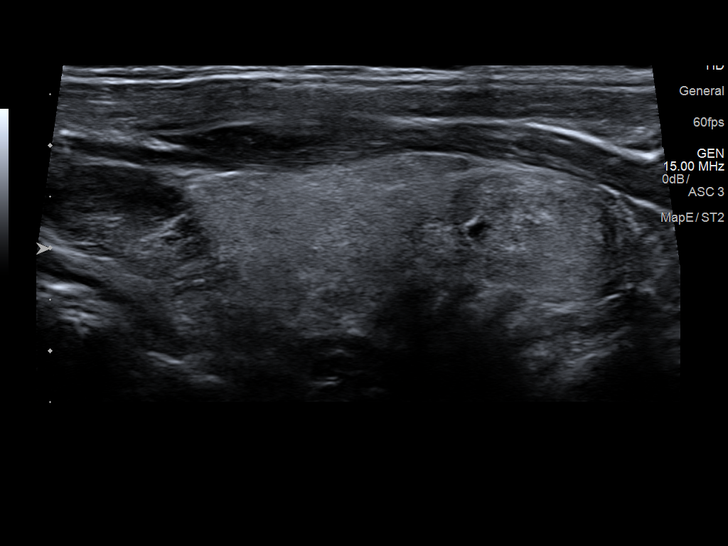
[im 53/56]
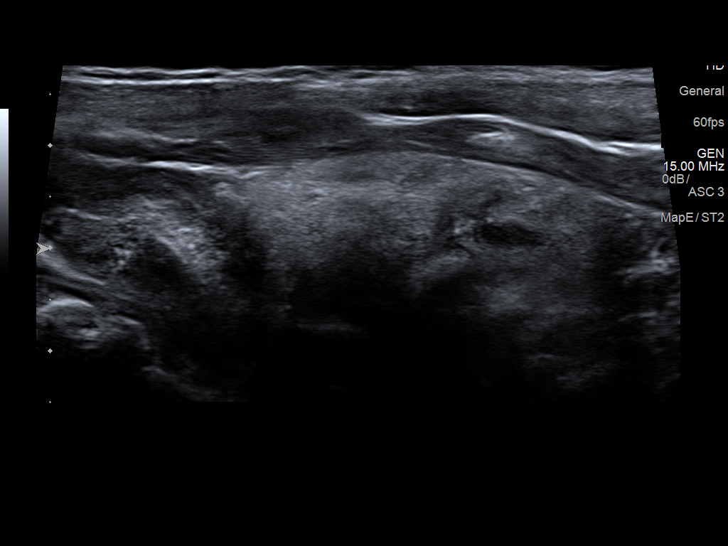

[12 of 25 positions shown; findings below may reference images not displayed]

FINDINGS: Parenchymal Echotexture: Moderately heterogenous

Isthmus: 7 mm

Right lobe: 5.6 x 2.5 x 2.6 cm

Left lobe: 4.6 x 1.7 x 1.8 cm

_________________________________________________________

Estimated total number of nodules >/= 1 cm: 3

Number of spongiform nodules >/=  2 cm not described below (TR1): 0

Number of mixed cystic and solid nodules >/= 1.5 cm not described
below (TR2): 0

_________________________________________________________

Nodule # 1:

Location: Right; Mid

Maximum size: 3.1 cm; Other 2 dimensions: 2.2 x 3.1 cm

Composition: solid/almost completely solid (2)

Echogenicity: isoechoic (1)

Shape: not taller-than-wide (0)

Margins: ill-defined (0)

Echogenic foci: macrocalcifications (1)

ACR TI-RADS total points: 4.

ACR TI-RADS risk category: TR4 (4-6 points).

ACR TI-RADS recommendations:

**Given size (>/= 1.5 cm) and appearance, fine needle aspiration of
this moderately suspicious nodule should be considered based on
TI-RADS criteria.

_________________________________________________________

Nodule # 2:

Location: Left; Mid

Maximum size: 0.9 cm; Other 2 dimensions: 0.9 x 0.9 cm

Composition: solid/almost completely solid (2)

Echogenicity: isoechoic (1)

Shape: not taller-than-wide (0)

Margins: ill-defined (0)

Echogenic foci: none (0)

ACR TI-RADS total points: 3.

ACR TI-RADS risk category: TR3 (3 points).

ACR TI-RADS recommendations:

Given size (<1.4 cm) and appearance, this nodule does NOT meet
TI-RADS criteria for biopsy or dedicated follow-up.

_________________________________________________________

Nodule # 3:

Location: Left; Inferior

Maximum size: 1.0 cm; Other 2 dimensions: 0.8 x 0.9 cm

Composition: solid/almost completely solid (2)

Echogenicity: isoechoic (1)

Shape: not taller-than-wide (0)

Margins: ill-defined (0)

Echogenic foci: none (0)

ACR TI-RADS total points: 3.

ACR TI-RADS risk category: TR3 (3 points).

ACR TI-RADS recommendations:

Given size (<1.4 cm) and appearance, this nodule does NOT meet
TI-RADS criteria for biopsy or dedicated follow-up.

_________________________________________________________

Nodule # 4:

Location: Left; Inferior

Maximum size: 1.8 cm; Other 2 dimensions: 1.7 x 1.2 cm

Composition: solid/almost completely solid (2)

Echogenicity: isoechoic (1)

Shape: not taller-than-wide (0)

Margins: ill-defined (0)

Echogenic foci: large comet-tail artifacts (0)

ACR TI-RADS total points: 3.

ACR TI-RADS risk category: TR3 (3 points).

ACR TI-RADS recommendations:

*Given size (>/= 1.5 - 2.4 cm) and appearance, a follow-up
ultrasound in 1 year should be considered based on TI-RADS criteria.

_________________________________________________________
IMPRESSION: 3.1 cm right midpole TR 4 nodule meets criteria for biopsy as above.
This correlates with the CT finding.

Additional left TR 3 nodules noted as described above. The 1.8 cm
left inferior TR 3 nodule meets criteria for follow-up in 1 year.

The above is in keeping with the ACR TI-RADS recommendations - [HOSPITAL] 3597;[DATE].

## 2019-12-24 DIAGNOSIS — Z23 Encounter for immunization: Secondary | ICD-10-CM | POA: Diagnosis not present

## 2020-01-21 DIAGNOSIS — Z23 Encounter for immunization: Secondary | ICD-10-CM | POA: Diagnosis not present

## 2020-02-20 ENCOUNTER — Encounter: Payer: Self-pay | Admitting: Gastroenterology

## 2020-02-20 DIAGNOSIS — R0602 Shortness of breath: Secondary | ICD-10-CM | POA: Diagnosis not present

## 2020-02-20 DIAGNOSIS — R7303 Prediabetes: Secondary | ICD-10-CM | POA: Diagnosis not present

## 2020-02-20 DIAGNOSIS — I803 Phlebitis and thrombophlebitis of lower extremities, unspecified: Secondary | ICD-10-CM | POA: Diagnosis not present

## 2020-02-20 DIAGNOSIS — E785 Hyperlipidemia, unspecified: Secondary | ICD-10-CM | POA: Diagnosis not present

## 2020-02-26 IMAGING — US US FNA BIOPSY THYROID 1ST LESION
1 series · 13 of 15 positions shown · non-contrast
Comparison: US Thyroid 10/06/2017

MEDICATIONS:
5 cc 1% lidocaine

COMPLICATIONS:
None immediate.

INDICATION: Indeterminate thyroid nodule

Right mid lobe thyroid nodule
3.1 cm
EXAM:
ULTRASOUND GUIDED FINE NEEDLE ASPIRATION OF INDETERMINATE THYROID
NODULE
TECHNIQUE: Informed written consent was obtained from the patient after a
discussion of the risks, benefits and alternatives to treatment.
Questions regarding the procedure were encouraged and answered. A
timeout was performed prior to the initiation of the procedure.

[Series 1: us fna biopsy thyroid 1st lesion · 0.07mm/px · 15 acquisitions, 13 frames shown]
[im 1/15]
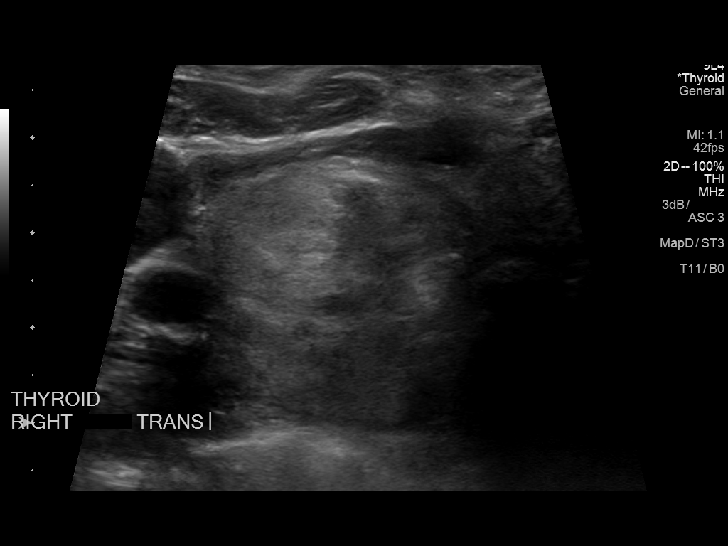
[im 2/15]
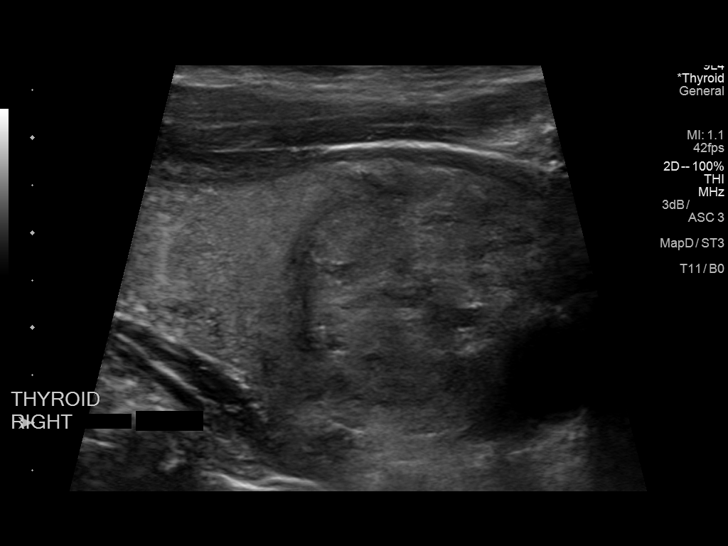
[im 3/15]
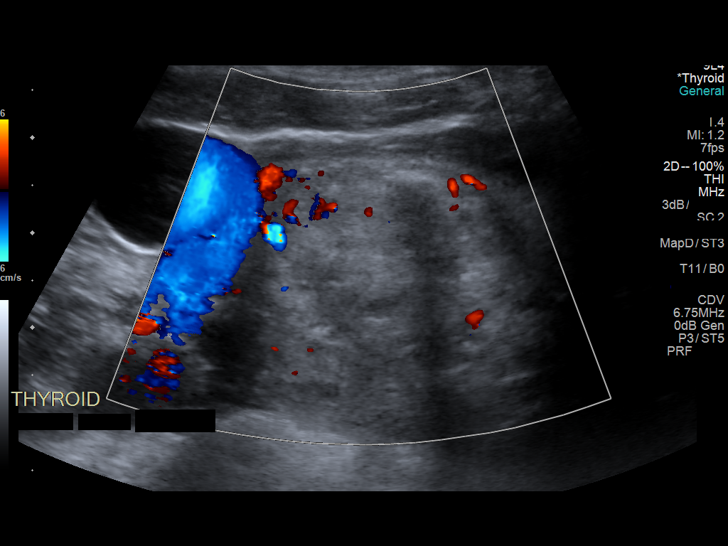
[im 5/15]
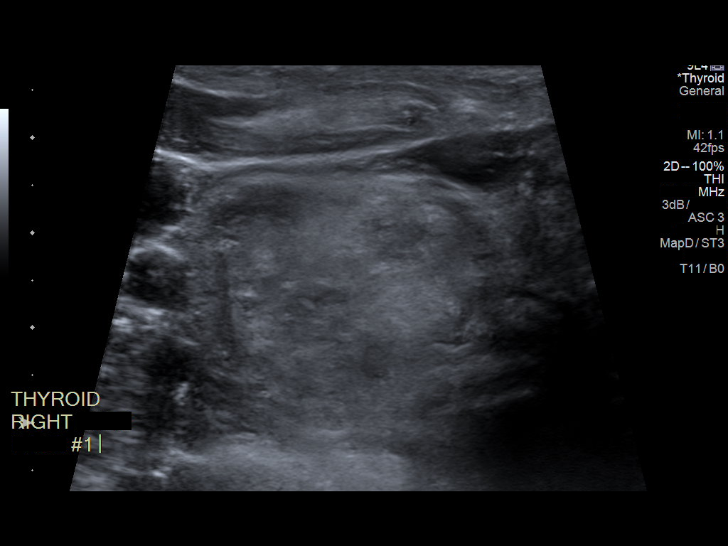
[im 6/15]
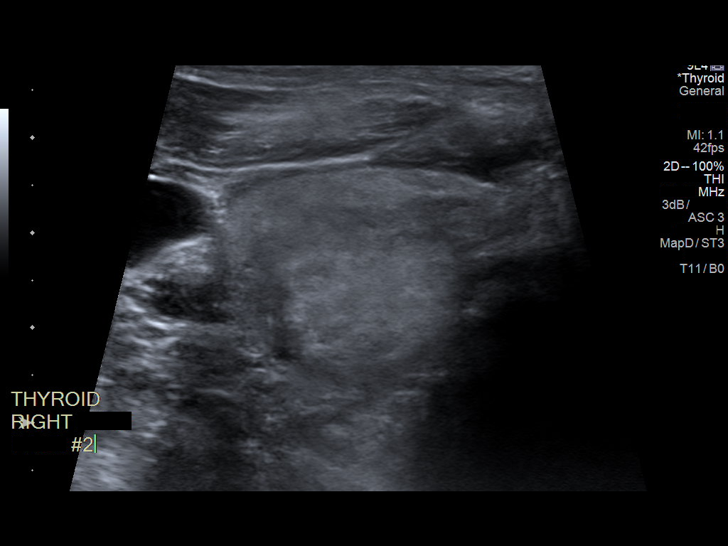
[im 7/15]
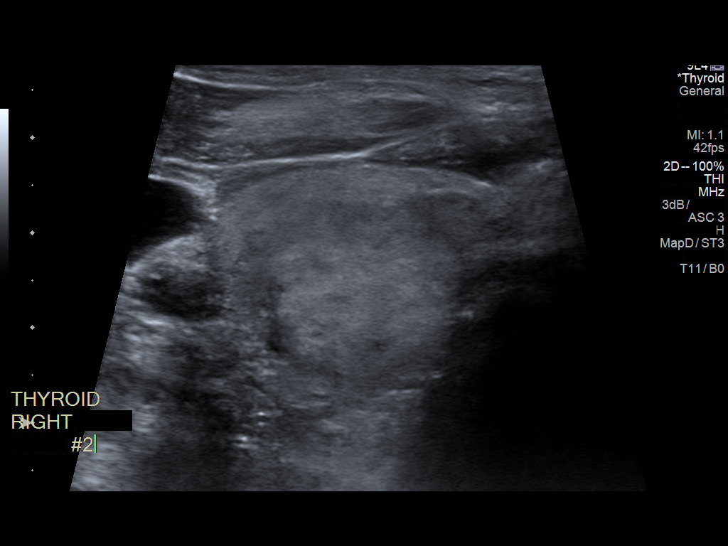
[im 8/15]
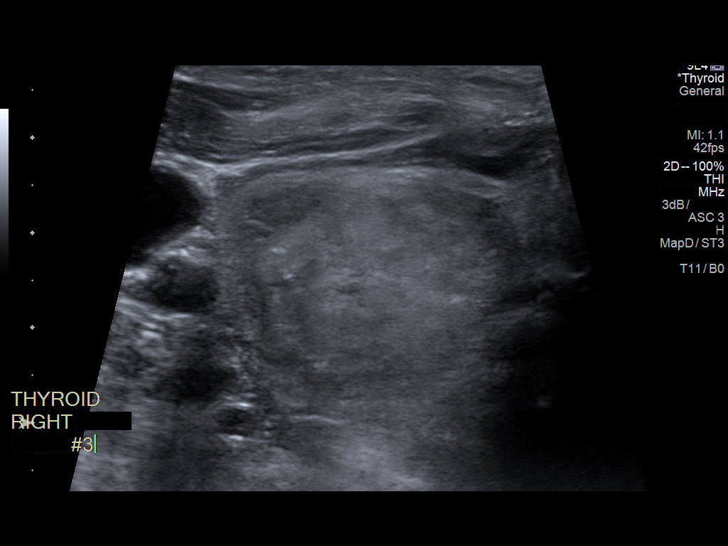
[im 9/15]
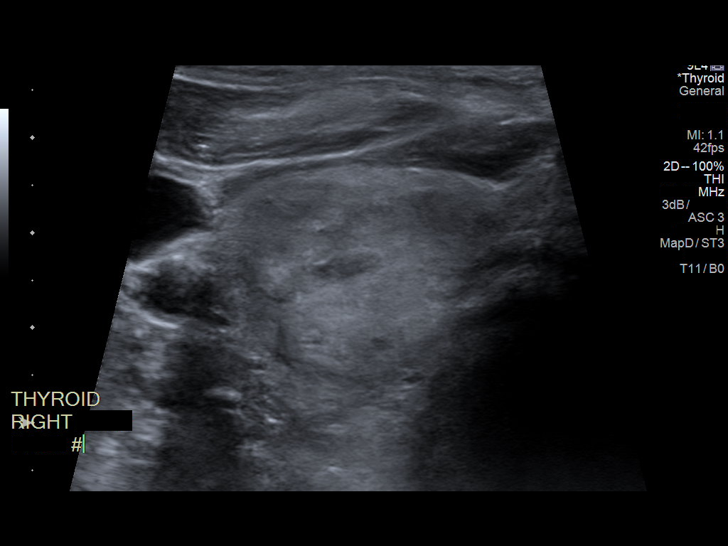
[im 10/15]
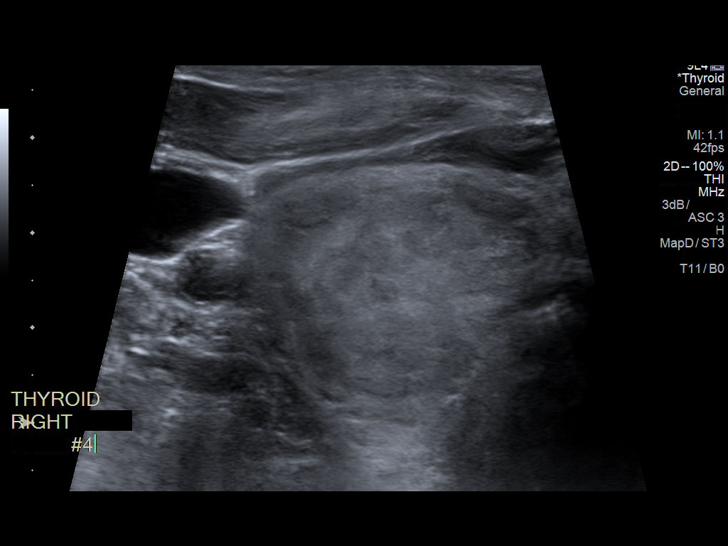
[im 11/15]
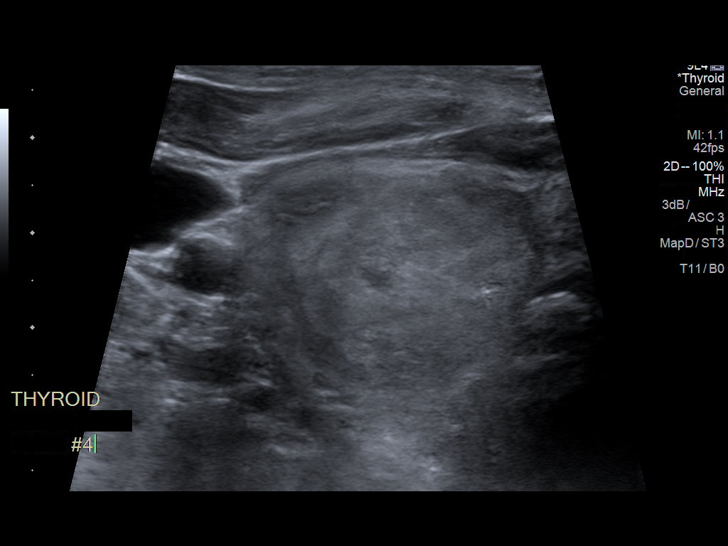
[im 13/15]
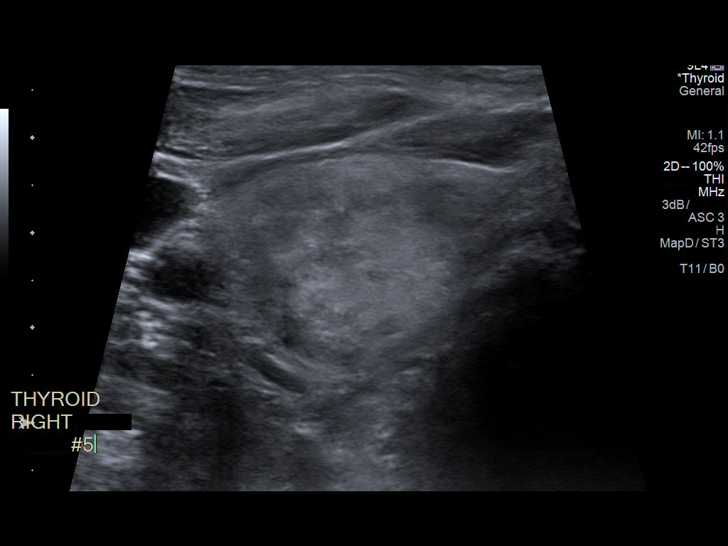
[im 14/15]
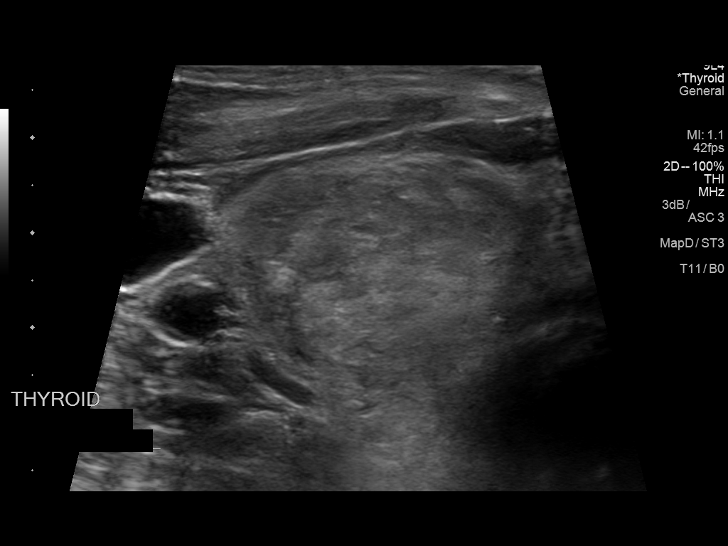
[im 15/15]
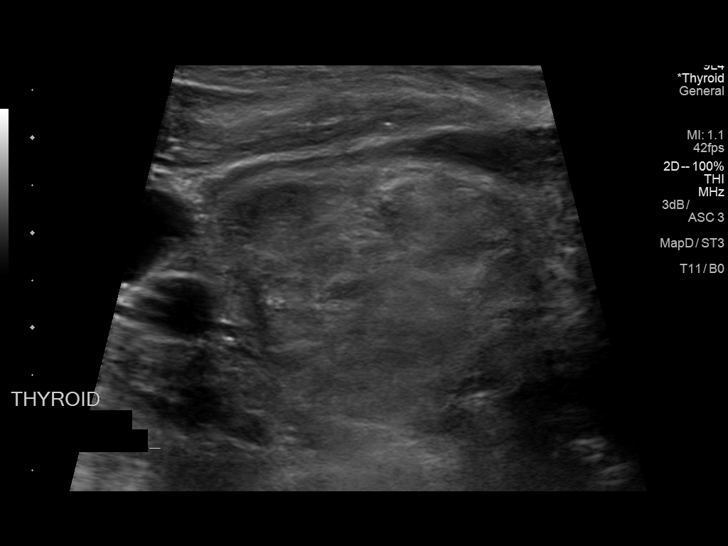

[13 of 15 positions shown; findings below may reference images not displayed]

Pre-procedural ultrasound scanning demonstrated unchanged size and
appearance of the indeterminate nodule within the right thyroid

The procedure was planned. The neck was prepped in the usual sterile
fashion, and a sterile drape was applied covering the operative
field. A timeout was performed prior to the initiation of the
procedure. Local anesthesia was provided with 1% lidocaine.

Under direct ultrasound guidance, 5 FNA biopsies were performed of
the right thyroid nodule with a 25 gauge needle.

2 of these samples were collected for AFIRMA per WEDUS.

Multiple ultrasound images were saved for procedural documentation
purposes. The samples were prepared and submitted to pathology.

Limited post procedural scanning was negative for hematoma or
additional complication. Dressings were placed. The patient
tolerated the above procedures procedure well without immediate
postprocedural complication.
FINDINGS: Nodule reference number based on prior diagnostic ultrasound: 1

Maximum size: 3.1 cm

Location: Right; Mid

ACR TI-RADS risk category: TR4 (4-6 points)

Reason for biopsy: meets ACR TI-RADS criteria

Ultrasound imaging confirms appropriate placement of the needles
within the thyroid nodule.
IMPRESSION: Technically successful ultrasound guided fine needle aspiration of
right mid lobe thyroid nodule

Read by

Wai Pou Andreaz

## 2020-02-27 DIAGNOSIS — R799 Abnormal finding of blood chemistry, unspecified: Secondary | ICD-10-CM | POA: Diagnosis not present

## 2020-02-27 DIAGNOSIS — D649 Anemia, unspecified: Secondary | ICD-10-CM | POA: Diagnosis not present

## 2020-02-27 DIAGNOSIS — D529 Folate deficiency anemia, unspecified: Secondary | ICD-10-CM | POA: Diagnosis not present

## 2020-03-08 DIAGNOSIS — D509 Iron deficiency anemia, unspecified: Secondary | ICD-10-CM | POA: Diagnosis not present

## 2020-03-09 DIAGNOSIS — Z8546 Personal history of malignant neoplasm of prostate: Secondary | ICD-10-CM | POA: Diagnosis not present

## 2020-03-09 DIAGNOSIS — R5383 Other fatigue: Secondary | ICD-10-CM | POA: Diagnosis not present

## 2020-03-09 DIAGNOSIS — Z9079 Acquired absence of other genital organ(s): Secondary | ICD-10-CM | POA: Diagnosis not present

## 2020-03-09 DIAGNOSIS — Z7982 Long term (current) use of aspirin: Secondary | ICD-10-CM | POA: Diagnosis not present

## 2020-03-09 DIAGNOSIS — R531 Weakness: Secondary | ICD-10-CM | POA: Diagnosis not present

## 2020-03-09 DIAGNOSIS — D509 Iron deficiency anemia, unspecified: Secondary | ICD-10-CM | POA: Diagnosis not present

## 2020-03-09 DIAGNOSIS — I498 Other specified cardiac arrhythmias: Secondary | ICD-10-CM | POA: Diagnosis not present

## 2020-03-09 DIAGNOSIS — D508 Other iron deficiency anemias: Secondary | ICD-10-CM | POA: Diagnosis not present

## 2020-03-09 DIAGNOSIS — R0602 Shortness of breath: Secondary | ICD-10-CM | POA: Diagnosis not present

## 2020-03-10 DIAGNOSIS — D508 Other iron deficiency anemias: Secondary | ICD-10-CM | POA: Diagnosis not present

## 2020-03-10 DIAGNOSIS — D509 Iron deficiency anemia, unspecified: Secondary | ICD-10-CM | POA: Diagnosis not present

## 2020-03-11 DIAGNOSIS — D509 Iron deficiency anemia, unspecified: Secondary | ICD-10-CM | POA: Diagnosis not present

## 2020-03-16 ENCOUNTER — Encounter: Payer: Self-pay | Admitting: Internal Medicine

## 2020-03-16 DIAGNOSIS — R69 Illness, unspecified: Secondary | ICD-10-CM | POA: Diagnosis not present

## 2020-03-26 DIAGNOSIS — R002 Palpitations: Secondary | ICD-10-CM | POA: Diagnosis not present

## 2020-03-26 DIAGNOSIS — D509 Iron deficiency anemia, unspecified: Secondary | ICD-10-CM | POA: Diagnosis not present

## 2020-03-29 NOTE — Progress Notes (Signed)
Referring Provider:  [name], MD Primary Care Physician:  [name], MD  Primary GI: Dr. Gala Romney  Patient Location: In his car at a car wash.  Not actively driving.   Provider Location: [place] office   Reason for Visit:  Anemia   Persons present on the virtual encounter, with roles:  Aliene Altes, PA-C (provider), Stanley Bass (patient)   Total time (minutes) spent on medical discussion: 20 minutes  Virtual Visit via Telephone Note Due to COVID-19, visit is conducted virtually and was requested by patient.   I connected with Stanley Bass on 03/30/20 at  9:00 AM EDT by video and verified that I am speaking with the correct person using two identifiers.   I discussed the limitations, risks, security and privacy concerns of performing an evaluation and management service by telephone and the availability of in person appointments. I also discussed with the patient that there may be a patient responsible charge related to this service. The patient expressed understanding and agreed to proceed.  Chief Complaint  Patient presents with  . Anemia     History of Present Illness: Stanley Bass is a 74 y.o. male presenting today with a history of [name], MD for iron deficiency anemia.  Patient was last seen by our staff at the time of screening colonoscopy completed 05/08/2017 with entirely normal exam.  Recommended repeat colonoscopy in 10 years.   Reviewed chart.  Patient was recently admitted to [facility] 03/09/2020-03/11/2020 with symptomatic iron deficiency anemia.  Symptoms included fatigue, weakness, lightheadedness, and occasional headaches over a 37-monthperiod.  Found have hemoglobin 8.2, ferritin 5, iron 14, saturation 3%.  INR and B12 within normal limits.  Kidney function and BUN within normal limits.  Denied melena.  I fob negative.  On daily baby aspirin and ibuprofen for lower extremity pain about 4 times a week.  Hemoglobin was trended with no significant  change and was 8.4 on day of discharge.  CT A/P with no findings to suggest malignancy or other source of blood loss.  H. pylori stool antigen negative.  Received IV iron x2.  He was discharged on ferrous gluconate 240 mg daily.  Urged to discontinue).  Most recent hemoglobin on file prior to hospitalization from October 2015 with hemoglobin 14.4.  Ferritin 18  (L), B12 153 (L) in 2015.   Today: Thought he has heard possible anemia in the past, but overall, this new. No blood in the stool or black stool. No abdominal pain. BMs daily. No unintentional weight loss. States he has been trying to lose weight. No GERD symptoms. No dysphagia. Occasional nausea if he eats something bad. This is not a regular occurence. Was taking 800 mg ibuprofen most mornings (at least 3-5 times a week) for years.   [facility]   [facility] This has improved. Has had a couple spells since leaving the hospital of feeling short of breath. [facility] Occasional dizziness. No repeat blood work since hospital discharge. No chest pain or heart palpitations. No nose bleeds, hematuria, or other obvious blood loss.   No family history of colon cancer. No stomach or other GI cancers.   Declined colonoscopy.  Explained that if no findings were found on EGD, we would need to consider colonoscopy.  Update CBC, iron panel.   Past Medical History:  Diagnosis Date  .  Prostate cancer ([place]) 2011  . Vertigo      Past Surgical History:  Procedure Laterality Date  . COLONOSCOPY N/A 05/08/2017   Procedure: COLONOSCOPY;  Surgeon: Daneil Dolin, MD;  [facility];  Service: Endoscopy;  Laterality: N/A;  1:00 pm  . PROSTATE SURGERY       Current Meds  Medication Sig  . acetaminophen (TYLENOL) 500 MG tablet Take 500 mg by mouth daily.  Marland Kitchen aspirin EC 81 MG tablet Take 81 mg by mouth daily.  .  Ferrous Sulfate (IRON) 325 (65 Fe) MG TABS Take by mouth daily.  . Magnesium 500 MG TABS Take 500 mg by mouth daily.  . Multiple Vitamin (MULTIVITAMIN WITH MINERALS) TABS tablet Take 1 tablet by mouth daily. Men's 50+  . triamcinolone cream (KENALOG) 0.1 % Apply 1 application topically as needed (for dry/irritated skin).   . vitamin B-12 (CYANOCOBALAMIN) 1000 MCG tablet Take 1,000 mcg by mouth every other day.      Family History  Problem Relation Age of Onset  . Diabetes Mother   . Stroke Father   . Heart attack Brother   . Colon cancer Neg Hx     Social History   Socioeconomic History  . Marital status: Married    Spouse name: Blanch Media  . Number of children: 3  . Years of education: HS  . Highest education level: Not on file  Occupational History  . Occupation: Retired    Fish farm manager: OTHER  Tobacco Use  . Smoking status: Never Smoker  . Smokeless tobacco: Never Used  Substance and Sexual Activity  . Alcohol use: No  . Drug use: No  . Sexual activity: Not on file  Other Topics Concern  . Not on file  Social History Narrative   Patient lives at home with his spouse.   Caffeine Use: 1-2 cups daily   Social Determinants of Health   Financial Resource Strain:   . Difficulty of Paying Living Expenses:   Food Insecurity:   . Worried About Charity fundraiser in the Last Year:   . Arboriculturist in the Last Year:   Transportation Needs:   . Film/video editor (Medical):   Marland Kitchen Lack of Transportation (Non-Medical):   Physical Activity:   . Days of Exercise per Week:   . Minutes of Exercise per Session:   Stress:   . Feeling of Stress :   Social Connections:   . Frequency of Communication with Friends and Family:   . Frequency of Social Gatherings with Friends and Family:   . Attends Religious Services:   . Active Member of Clubs or Organizations:   . Attends Archivist Meetings:   Marland Kitchen Marital Status:    Review of Systems: Gen: Denies fever, chills, cold or  flulike symptoms. CV: See HPI Resp: See HPI GI: See HPI Derm: Denies rash Psych: Mild anxiety  Observations/Objective: No distress.  Alert and oriented.  Normal mood and affect.  Dressed appropriately.  No scleral icterus.  Unable to perform complete physical exam due to video encounter.   Labs:  02/28/2020: CBC: WBC 5.5, hemoglobin 8.6(L), MCV 69.3 (L), MCH 18.2 (L), MCHC 26.2 (L), platelets 304. Iron panel: Iron 11 (L), saturation 2% (L), ferritin 4 (L). Folate>24 B12 588  02/21/2020: CBC: WBC 4.8, hemoglobin 8.5 (L), MCV 67.8 (L), MCH 18.7 (L), MCHC 27.6 (L), platelets 276 CMP: Glucose 98, creatinine 0.99, sodium 138, potassium 4.0, calcium 9.0, albumin 4.1, total bilirubin 0.4, alk  phos 59, AST 20, ALT 16 Hemoglobin A1c 5.8  Assessment and Plan: 74 year old male with history of prostate cancer in 2011 and vertigo presenting today for further evaluation of IDA.  Recent hospitalization in mid April at [facility] for symptomatic iron deficiency anemia.  Found to have hemoglobin 8.2, ferritin 5, iron 14, saturation 3%.  B12 within normal limits.  Folate completed by PCP within normal limits.  I FOBT was negative at [place]. pylori stool antigen negative. Hemoglobin remained stable during admission.  No blood transfusion or endoscopic evaluation needed. He received IV iron during hospitalization and was started on once a day oral iron.  Last hemoglobin in our system prior to now was 14.4 in October 2015.  Interestingly, ferritin was 18 at that time.   Patient denies bright red blood per rectum, melena, or any significant upper or lower GI symptoms.  Symptoms of shortness of breath and lightheadedness have improved.  He is on a daily baby aspirin and had been taking 800 mg ibuprofen 3-5 times a week for years.  Has since discontinued ibuprofen.  Not on a PPI.  Labs have not been updated.  Last colonoscopy 2018 entirely normal.  No family history of colon cancer or other GI  cancers.  Concern for upper GI blood loss considering chronic NSAID use.  May have gastritis, duodenitis, PUD, or bleeding from AVMs.  Cannot rule out small bowel or colonic etiology including colon polyps or malignancy. Offered EGD and repeat colonoscopy for complete evaluation.  Patient declined colonoscopy but was agreeable to EGD.   Plan:  1.  Repeat CBC and iron panel 2.  Continue iron daily. 3.  Proceed with EGD with Dr. Gala Romney in the near future.The risks, benefits, and alternatives have been discussed in detail with patient. They have stated understanding and desire to proceed.  Hold iron for 7 days prior to procedure. 4.  Continue to avoid all NSAIDs. 5.  Monitor for black tarry stools or bright red blood per rectum and let us know immediately. 6.  Follow-up after EGD.  Call with questions or concerns prior.  Follow Up Instructions: Follow-up after EGD.  Call with questions or concerns prior.   I discussed the assessment and treatment plan with the patient. The patient was provided an opportunity to ask questions and all were answered. The patient agreed with the plan and demonstrated an understanding of the instructions.   The patient was advised to call back or seek an in-person evaluation if the symptoms worsen or if the condition fails to improve as anticipated.  I provided 20 minutes of non-face-to-face time during this encounter.  Aliene Altes, PA-C [facility] Gastroenterology

## 2020-03-30 ENCOUNTER — Encounter: Payer: Self-pay | Admitting: Gastroenterology

## 2020-03-30 ENCOUNTER — Other Ambulatory Visit: Payer: Self-pay

## 2020-03-30 ENCOUNTER — Telehealth: Payer: Self-pay

## 2020-03-30 ENCOUNTER — Telehealth (INDEPENDENT_AMBULATORY_CARE_PROVIDER_SITE_OTHER): Payer: Medicare HMO | Admitting: Gastroenterology

## 2020-03-30 DIAGNOSIS — D509 Iron deficiency anemia, unspecified: Secondary | ICD-10-CM | POA: Diagnosis not present

## 2020-03-30 NOTE — Telephone Encounter (Signed)
Attempted to submit PA for EGD via [facility] website:  [place] - Product does not have notification requirements.

## 2020-03-30 NOTE — Patient Instructions (Addendum)
We will get you scheduled for an upper endoscopy in the near future with Dr. Gala Romney. Prior to procedure, you will need to hold iron for 7 days.  Please have your labs (CBC and iron panel) completed in the next week at [facility] so we can monitor for stability.  Continue taking iron daily.  Continue to avoid all NSAIDs including ibuprofen, Aleve, Advil, Goody powders, and naproxen.  Monitor for any black tarry stools or bright red blood per rectum and let us know immediately.  We will plan to see back after your upper endoscopy.  Call with questions or concerns prior.  Aliene Altes, PA-C [facility] Gastroenterology

## 2020-03-30 NOTE — Telephone Encounter (Signed)
Called pt, EGD w/RMR scheduled for 04/25/20 at 12:15pm. Order entered. COVID test 04/24/20 at 9:35am. Appt letter mailed with procedure instructions.

## 2020-04-04 DIAGNOSIS — D509 Iron deficiency anemia, unspecified: Secondary | ICD-10-CM | POA: Diagnosis not present

## 2020-04-05 ENCOUNTER — Telehealth: Payer: Self-pay | Admitting: *Deleted

## 2020-04-05 NOTE — Telephone Encounter (Signed)
Pt called in and requested to cancel his procedure.  Offered to reschedule it for pt but he declined.  Informed Carolyn in Endo.  Routing to RMR as a  FYI.

## 2020-04-05 NOTE — Telephone Encounter (Signed)
Communication noted.  

## 2020-04-16 DIAGNOSIS — D509 Iron deficiency anemia, unspecified: Secondary | ICD-10-CM | POA: Diagnosis not present

## 2020-04-16 DIAGNOSIS — E785 Hyperlipidemia, unspecified: Secondary | ICD-10-CM | POA: Diagnosis not present

## 2020-04-16 DIAGNOSIS — R7303 Prediabetes: Secondary | ICD-10-CM | POA: Diagnosis not present

## 2020-04-18 DIAGNOSIS — R69 Illness, unspecified: Secondary | ICD-10-CM | POA: Diagnosis not present

## 2020-04-24 ENCOUNTER — Other Ambulatory Visit (HOSPITAL_COMMUNITY): Payer: Medicare HMO

## 2020-04-25 ENCOUNTER — Encounter (HOSPITAL_COMMUNITY): Payer: Self-pay

## 2020-04-25 ENCOUNTER — Ambulatory Visit (HOSPITAL_COMMUNITY): Admit: 2020-04-25 | Payer: MEDICARE | Admitting: Internal Medicine

## 2020-04-25 SURGERY — EGD (ESOPHAGOGASTRODUODENOSCOPY)
Anesthesia: Moderate Sedation

## 2020-05-01 ENCOUNTER — Ambulatory Visit: Payer: Medicare HMO | Admitting: Cardiology

## 2020-05-01 ENCOUNTER — Encounter: Payer: Self-pay | Admitting: Cardiology

## 2020-05-01 ENCOUNTER — Other Ambulatory Visit: Payer: Self-pay

## 2020-05-01 ENCOUNTER — Ambulatory Visit: Payer: Medicare HMO

## 2020-05-01 VITALS — BP 125/78 | HR 68 | Ht 66.0 in | Wt 190.0 lb

## 2020-05-01 DIAGNOSIS — R55 Syncope and collapse: Secondary | ICD-10-CM

## 2020-05-01 DIAGNOSIS — R002 Palpitations: Secondary | ICD-10-CM | POA: Diagnosis not present

## 2020-05-01 DIAGNOSIS — I491 Atrial premature depolarization: Secondary | ICD-10-CM | POA: Diagnosis not present

## 2020-05-01 DIAGNOSIS — D509 Iron deficiency anemia, unspecified: Secondary | ICD-10-CM

## 2020-05-01 DIAGNOSIS — Z8249 Family history of ischemic heart disease and other diseases of the circulatory system: Secondary | ICD-10-CM | POA: Diagnosis not present

## 2020-05-01 DIAGNOSIS — R7303 Prediabetes: Secondary | ICD-10-CM

## 2020-05-01 NOTE — Progress Notes (Signed)
Date:  05/01/2020   ID:  AUTHOR HATLESTAD, DOB 1946/03/06, MRN [id number]  PCP:  [name], MD  Cardiologist:  Rex Kras, DO, [facility] (established care 05/01/2020)  REASON FOR CONSULT: Palpitations  REQUESTING PHYSICIAN:  [name], MD [address] [place],  [place] [address]  Chief Complaint  Patient presents with  . Palpitations    pt was having palpitatiions  . New Patient (Initial Visit)    HPI  Stanley Bass is a 74 y.o. male who is being seen today for the evaluation of palpitations and cardiac risk stratification given his cardiovascular risk factors at the request of [name], MD. Patient's past medical history and cardiac risk factors include: Prediabetes, history of near syncope, iron deficiency anemia, advanced age, family history of premature coronary artery disease.   Patient was referred to the office at the request of his primary care provider for cardiac evaluation given his cardiovascular risk factors and his recent episodes of palpitation and near syncope.  Patient states that he was having episodes of near syncope and palpitations that were going on for several months.  He stated that the symptoms were getting progressively worse and he went to [facility] for further evaluation.  He was noted to have low iron levels and was given IV iron as well as iron supplements orally prior to discharge.  Since then patient states that his symptoms have improved.  He was recommended to undergo colonoscopy and EGD but he refuses.  From a cardiovascular standpoint patient has premature coronary artery disease in the family as his father had a stroke at the age of 74 and brother had a myocardial infarction at the age of 25.  Clinically patient is not in congestive heart failure.  He does not have any chest pain or anginal equivalent.  Based on the records received patient is prediabetic and also has remote history of hypertriglyceridemia.  I do not have the  most recent lipid profile for review.  Father had Stroke at age of 34 and brother had MI at age of 62.   Denies prior history of coronary artery disease, myocardial infarction, congestive heart failure, deep venous thrombosis, pulmonary embolism, stroke, transient ischemic attack.  FUNCTIONAL STATUS: Enjoys playing golf and does yardwork. He walks atleast 3-45miles a day.   ALLERGIES: Allergies  Allergen Reactions  . Shellfish-Derived Products Hives  . Penicillins Rash    Has patient had a PCN reaction causing immediate rash, facial/tongue/throat swelling, SOB or lightheadedness with hypotension: Unknown Has patient had a PCN reaction causing severe rash involving mucus membranes or skin necrosis:Unknown Has patient had a PCN reaction that required hospitalization: No Has patient had a PCN reaction occurring within the last 10 years: No If all of the above answers are "NO", then may proceed with Cephalosporin use.     MEDICATION LIST PRIOR TO VISIT: Current Meds  Medication Sig  . acetaminophen (TYLENOL) 500 MG tablet Take 500 mg by mouth daily.  Marland Kitchen aspirin EC 81 MG tablet Take 81 mg by mouth daily.  . Ferrous Sulfate (IRON) 325 (65 Fe) MG TABS Take by mouth daily.  . Magnesium 500 MG TABS Take 500 mg by mouth daily.  . Multiple Vitamin (MULTIVITAMIN WITH MINERALS) TABS tablet Take 1 tablet by mouth daily. Men's 50+  . triamcinolone cream (KENALOG) 0.1 % Apply 1 application topically as needed (for dry/irritated skin).   . vitamin B-12 (CYANOCOBALAMIN) 1000 MCG tablet Take 1,000 mcg by mouth  every other day.      PAST MEDICAL HISTORY: Past Medical History:  Diagnosis Date  . Prediabetes   . Prostate cancer ([place]) 2011  . Vertigo     PAST SURGICAL HISTORY: Past Surgical History:  Procedure Laterality Date  . COLONOSCOPY N/A 05/08/2017   Procedure: COLONOSCOPY;  Surgeon: Daneil Dolin, MD; normal exam.  Repeat in 10 years.  Marland Kitchen PROSTATE SURGERY      FAMILY HISTORY: The  patient family history includes Diabetes in his mother; Heart attack in his brother; Stroke in his father.  SOCIAL HISTORY:  The patient  reports that he has never smoked. He has never used smokeless tobacco. He reports that he does not drink alcohol or use drugs.  REVIEW OF SYSTEMS: Review of Systems  Constitution: Negative for chills and fever.  HENT: Negative for hoarse voice and nosebleeds.   Eyes: Negative for discharge, double vision and pain.  Cardiovascular: Positive for near-syncope (history of but no recent episodes.). Negative for chest pain, claudication, dyspnea on exertion, leg swelling, orthopnea, palpitations, paroxysmal nocturnal dyspnea and syncope.  Respiratory: Negative for hemoptysis and shortness of breath.   Musculoskeletal: Negative for muscle cramps and myalgias.  Gastrointestinal: Negative for abdominal pain, constipation, diarrhea, hematemesis, hematochezia, melena, nausea and vomiting.  Neurological: Negative for dizziness and light-headedness.    PHYSICAL EXAM: Vitals with BMI 05/01/2020 08/03/2018 07/13/2018  Height 5\' 6"  5\' 6"  5\' 6"   Weight 190 lbs 192 lbs 196 lbs  BMI 00.93 31 81.82  Systolic 993 716 967  Diastolic 78 74 64  Pulse 68 71 68    CONSTITUTIONAL: Well-developed and well-nourished. No acute distress.  SKIN: Skin is warm and dry. No rash noted. No cyanosis. No pallor. No jaundice HEAD: Normocephalic and atraumatic.  EYES: No scleral icterus MOUTH/THROAT: Moist oral membranes.  NECK: No JVD present. No thyromegaly noted. No carotid bruits  LYMPHATIC: No visible cervical adenopathy.  CHEST Normal respiratory effort. No intercostal retractions  LUNGS: Clear to auscultation bilaterally.  No stridor. No wheezes. No rales.  CARDIOVASCULAR: Regular rate and rhythm, positive S1-S2, no murmurs rubs or gallops appreciated ABDOMINAL: No apparent ascites.  EXTREMITIES: No peripheral edema  HEMATOLOGIC: No significant bruising NEUROLOGIC: Oriented to  person, place, and time. Nonfocal. Normal muscle tone.  PSYCHIATRIC: Normal mood and affect. Normal behavior. Cooperative  CARDIAC DATABASE: EKG: 05/01/2020: Sinus  Rhythm, 66bpm. Normal axis. Poor R wave progression Frequent PACs. Left atrial enlargement.   Echocardiogram: None  Stress Testing: More than 10 years ago.    LABORATORY DATA: CBC Latest Ref Rng & Units 09/15/2014 04/07/2009  WBC 3.4 - 10.8 x10E3/uL 5.1 5.8  Hemoglobin 12.6 - 17.7 g/dL 14.4 13.8  Hematocrit 37.5 - 51.0 % 44.9 40.5  Platelets 150 - 379 x10E3/uL 257 238    CMP Latest Ref Rng & Units 09/15/2014 04/07/2009  Glucose 65 - 99 mg/dL 89 120(H)  BUN 8 - 27 mg/dL 13 14  Creatinine 0.76 - 1.27 mg/dL 1.07 0.96  Sodium 134 - 144 mmol/L 139 141  Potassium 3.5 - 5.2 mmol/L 4.5 3.7  Chloride 97 - 108 mmol/L 97 103  CO2 18 - 29 mmol/L 26 30  Calcium 8.6 - 10.2 mg/dL 9.5 9.1  Total Protein 6.0 - 8.5 g/dL 7.6 7.5  Total Bilirubin 0.0 - 1.2 mg/dL 0.4 0.9  Alkaline Phos 39 - 117 IU/L 85 82  AST 0 - 40 IU/L 27 45(H)  ALT 0 - 44 IU/L 29 42    Lipid Panel  No  results found for: CHOL, TRIG, HDL, CHOLHDL, VLDL, LDLCALC, LDLDIRECT, LABVLDL  No components found for: NTPROBNP No results for input(s): PROBNP in the last 8760 hours. No results for input(s): TSH in the last 8760 hours.  BMP No results for input(s): NA, K, CL, CO2, GLUCOSE, BUN, CREATININE, CALCIUM, GFRNONAA, GFRAA in the last 8760 hours.  HEMOGLOBIN A1C Lab Results  Component Value Date   HGBA1C 6.5 (H) 09/15/2014    Cardiac Panel (last 3 results) No results for input(s): CKTOTAL, CKMB, TROPONINIHS, RELINDX in the last 72 hours.  CHOLESTEROL No results for input(s): CHOL in the last 8760 hours.  Hepatic Function Panel No results for input(s): PROT, ALBUMIN, AST, ALT, ALKPHOS, BILITOT, BILIDIR, IBILI in the last 8760 hours.   External Labs: Collected: [facility].  Creatinine 0.92 mg/dL. TSH: 2.117  IMPRESSION:     ICD-10-CM   1. Palpitations  R00.2 EKG 12-Lead    EXTERNAL ECG MONITOR (48HRS-7DAYS) REVIEW AND INTERPRETATION  2. Near syncope  R55   3. Family history of premature coronary artery disease  Z82.49 PCV ECHOCARDIOGRAM COMPLETE    PCV CARDIAC STRESS TEST    Lipid Panel With LDL/HDL Ratio  4. Prediabetes  R73.03   5. Iron deficiency anemia, unspecified iron deficiency anemia type  D50.9   6. PAC (premature atrial contraction)  I49.1 EXTERNAL ECG MONITOR (48HRS-7DAYS) REVIEW AND INTERPRETATION     RECOMMENDATIONS: JACON WHETZEL is a 74 y.o. male whose past medical history and cardiac risk factors include: Prediabetes, history of near syncope, iron deficiency anemia, advanced age, family history of premature coronary artery disease.   Palpitations:  We will do a 7-day Holter monitor to evaluate for underlying atrial fibrillation or arrhythmic burden.  EKG shows sinus rhythm with PACs.  Patient states that his symptoms of palpitation have improved significantly since the supplementation of iron.  Premature atrial contractions: See above  Family history of premature coronary artery disease:  Patient currently on aspirin 81 mg p.o. daily.  Echocardiogram will be ordered to evaluate for structural heart disease and left ventricular systolic function.  We will perform an exercise treadmill stress test to evaluate for exercise-induced ischemia.  Patient has been vaccinated for COVID-19.  Patient has good functional capacity for age.  Check fasting lipid profile  Prediabetes: Educated on importance of lifestyle modification.  Currently managed by primary team.  FINAL MEDICATION LIST END OF ENCOUNTER: No orders of the defined types were placed in this encounter.   There are no discontinued medications.   Current Outpatient Medications:  .  acetaminophen (TYLENOL) 500 MG tablet, Take 500 mg by mouth daily., Disp: , Rfl:  .  aspirin EC 81 MG tablet, Take 81 mg by mouth daily.,  Disp: , Rfl:  .  Ferrous Sulfate (IRON) 325 (65 Fe) MG TABS, Take by mouth daily., Disp: , Rfl:  .  Magnesium 500 MG TABS, Take 500 mg by mouth daily., Disp: , Rfl:  .  Multiple Vitamin (MULTIVITAMIN WITH MINERALS) TABS tablet, Take 1 tablet by mouth daily. Men's 50+, Disp: , Rfl:  .  triamcinolone cream (KENALOG) 0.1 %, Apply 1 application topically as needed (for dry/irritated skin). , Disp: , Rfl:  .  vitamin B-12 (CYANOCOBALAMIN) 1000 MCG tablet, Take 1,000 mcg by mouth every other day. , Disp: , Rfl:   Orders Placed This Encounter  Procedures  . Lipid Panel With LDL/HDL Ratio  . PCV CARDIAC STRESS TEST  . EXTERNAL ECG MONITOR (48HRS-7DAYS) REVIEW AND INTERPRETATION  .  EKG 12-Lead  . PCV ECHOCARDIOGRAM COMPLETE    There are no Patient Instructions on file for this visit.   --Continue cardiac medications as reconciled in final medication list. --Return in about 6 weeks (around 06/12/2020) for re-evaluation of symptoms., review test results.. Or sooner if needed. --Continue follow-up with your primary care physician regarding the management of your other chronic comorbid conditions.  Patient's questions and concerns were addressed to his satisfaction. He voices understanding of the instructions provided during this encounter.   This note was created using a voice recognition software as a result there may be grammatical errors inadvertently enclosed that do not reflect the nature of this encounter. Every attempt is made to correct such errors.  Rex Kras, [place], [facility]  Pager: [phone] Office: [phone]

## 2020-05-09 ENCOUNTER — Other Ambulatory Visit: Payer: Self-pay

## 2020-05-09 ENCOUNTER — Ambulatory Visit: Payer: Medicare HMO

## 2020-05-09 DIAGNOSIS — Z8249 Family history of ischemic heart disease and other diseases of the circulatory system: Secondary | ICD-10-CM

## 2020-05-09 DIAGNOSIS — R55 Syncope and collapse: Secondary | ICD-10-CM | POA: Diagnosis not present

## 2020-05-11 NOTE — Progress Notes (Signed)
Left vm to cb.

## 2020-05-18 ENCOUNTER — Telehealth: Payer: Self-pay

## 2020-05-21 ENCOUNTER — Other Ambulatory Visit: Payer: Self-pay

## 2020-05-21 ENCOUNTER — Ambulatory Visit: Payer: Medicare HMO

## 2020-05-21 DIAGNOSIS — Z8249 Family history of ischemic heart disease and other diseases of the circulatory system: Secondary | ICD-10-CM

## 2020-05-30 ENCOUNTER — Telehealth: Payer: Self-pay

## 2020-05-30 NOTE — Telephone Encounter (Signed)
Called pt no answer, left a vm to returned the call back

## 2020-05-30 NOTE — Telephone Encounter (Signed)
-----   Message from [place], [place] sent at 05/29/2020  5:22 PM EDT ----- Called the patient and had to leave a message to call back.

## 2020-05-31 ENCOUNTER — Telehealth: Payer: Self-pay

## 2020-05-31 NOTE — Telephone Encounter (Signed)
-----   Message from [place], [place] sent at 05/29/2020  5:22 PM EDT ----- Called the patient and had to leave a message to call back.

## 2020-05-31 NOTE — Telephone Encounter (Signed)
Discussed results with patient. Confirmed appointment. Patient verbalized understanding.

## 2020-06-01 NOTE — Progress Notes (Signed)
2nd attempt: Called patient, NA, LMAM to call back about stress test results.

## 2020-06-08 ENCOUNTER — Telehealth: Payer: Self-pay

## 2020-06-08 NOTE — Telephone Encounter (Signed)
Called pt to inform him about is treadmill results, no answer, left a vm to return call back.

## 2020-06-08 NOTE — Telephone Encounter (Signed)
-----   Message from [place], [place] sent at 05/31/2020  7:01 PM EDT ----- Please inform the patient that his exercise treadmill stress test was reported to be abnormal.  He needs additional work-up.  Please set up a follow-up appointment the next available to discuss findings.

## 2020-06-14 ENCOUNTER — Ambulatory Visit: Payer: Medicare HMO | Admitting: Cardiology

## 2020-06-18 DIAGNOSIS — D509 Iron deficiency anemia, unspecified: Secondary | ICD-10-CM | POA: Diagnosis not present

## 2020-06-18 DIAGNOSIS — R7303 Prediabetes: Secondary | ICD-10-CM | POA: Diagnosis not present

## 2020-06-18 DIAGNOSIS — E785 Hyperlipidemia, unspecified: Secondary | ICD-10-CM | POA: Diagnosis not present

## 2020-06-21 ENCOUNTER — Ambulatory Visit: Payer: Medicare HMO | Admitting: Cardiology

## 2020-06-21 ENCOUNTER — Other Ambulatory Visit: Payer: Self-pay

## 2020-06-21 ENCOUNTER — Encounter: Payer: Self-pay | Admitting: Cardiology

## 2020-06-21 VITALS — BP 134/68 | HR 69 | Resp 15 | Ht 66.0 in | Wt 186.0 lb

## 2020-06-21 DIAGNOSIS — R55 Syncope and collapse: Secondary | ICD-10-CM | POA: Diagnosis not present

## 2020-06-21 DIAGNOSIS — I491 Atrial premature depolarization: Secondary | ICD-10-CM | POA: Diagnosis not present

## 2020-06-21 DIAGNOSIS — R9439 Abnormal result of other cardiovascular function study: Secondary | ICD-10-CM | POA: Diagnosis not present

## 2020-06-21 DIAGNOSIS — Z8249 Family history of ischemic heart disease and other diseases of the circulatory system: Secondary | ICD-10-CM | POA: Diagnosis not present

## 2020-06-21 DIAGNOSIS — R7303 Prediabetes: Secondary | ICD-10-CM | POA: Diagnosis not present

## 2020-06-21 DIAGNOSIS — R002 Palpitations: Secondary | ICD-10-CM | POA: Diagnosis not present

## 2020-06-21 MED ORDER — METOPROLOL SUCCINATE ER 25 MG PO TB24: 25.0000 mg | ORAL_TABLET | Freq: Every morning | ORAL | 0 refills | Status: DC

## 2020-06-21 NOTE — Progress Notes (Signed)
Date:  06/21/2020   ID:  HAYDYN GIRVAN, DOB 08/12/1946, MRN [id number]  PCP:  [name], MD  Cardiologist:  Rex Kras, DO, [facility] (established care 05/01/2020)  Date: 06/21/20 Last Office Visit: 05/01/2020  Chief Complaint  Patient presents with  . Palpitations  . Follow-up    test results    HPI  Stanley Bass is a 73 y.o. male who presents to the office with a chief complaint of "review test results and reevaluation of palpitations." Patient's past medical history and cardiac risk factors include: Prediabetes, history of near syncope, iron deficiency anemia, advanced age, family history of premature coronary artery disease.  Patient was referred to the office at the request of his primary care provider for cardiac evaluation given his cardiovascular risk factors and his recent episodes of palpitation and near syncope.  At the last office visit patient was mentioning that he was having symptoms of near syncope and palpitations that were present for the last several months.  And had gone to [facility] for further evaluation.  He was noted to have low iron levels and was supplemented with IV and oral iron and since then the symptoms have not resurfaced.  However, he was referred to the office for further evaluation as he also has additional cardiovascular symptoms.  Since last office visit patient states that he is doing well.  He played golf couple days ago and today he was mowing the yard.  He does not have any effort related chest pain or shortness of breath.  Since last office visit patient has undergone a 7-day Holter monitor exercise treadmill stress test and echocardiogram.  Results of all the 3 modalities were reviewed with him in great detail and findings noted below for further reference.  In summary, patient was noted to have preserved left ventricular systolic function with mild valvular heart disease.  Exercise treadmill stress test was abnormal normal if he  had ischemic changes on stress ECG and 7-day Holter monitor noted evidence of supraventricular ectopic burden of approximately 9%.    Father had Stroke at age of 50 and brother had MI at age of 27.   Denies prior history of coronary artery disease, myocardial infarction, congestive heart failure, deep venous thrombosis, pulmonary embolism, stroke, transient ischemic attack.  FUNCTIONAL STATUS: Enjoys playing golf and does yardwork. He walks atleast 3-64miles a day.   ALLERGIES: Allergies  Allergen Reactions  . Shellfish-Derived Products Hives  . Penicillins Rash    Has patient had a PCN reaction causing immediate rash, facial/tongue/throat swelling, SOB or lightheadedness with hypotension: Unknown Has patient had a PCN reaction causing severe rash involving mucus membranes or skin necrosis:Unknown Has patient had a PCN reaction that required hospitalization: No Has patient had a PCN reaction occurring within the last 10 years: No If all of the above answers are "NO", then may proceed with Cephalosporin use.     MEDICATION LIST PRIOR TO VISIT: Current Meds  Medication Sig  . aspirin EC 81 MG tablet Take 81 mg by mouth daily.  . Ferrous Sulfate (IRON) 325 (65 Fe) MG TABS Take by mouth daily.  . Magnesium 500 MG TABS Take 500 mg by mouth daily.  . Multiple Vitamin (MULTIVITAMIN WITH MINERALS) TABS tablet Take 1 tablet by mouth daily. Men's 50+  . vitamin B-12 (CYANOCOBALAMIN) 1000 MCG tablet Take 1,000 mcg by mouth every other day.      PAST MEDICAL HISTORY: Past Medical History:  Diagnosis Date  . Prediabetes   .  Prostate cancer ([place]) 2011  . Vertigo     PAST SURGICAL HISTORY: Past Surgical History:  Procedure Laterality Date  . COLONOSCOPY N/A 05/08/2017   Procedure: COLONOSCOPY;  Surgeon: Daneil Dolin, MD; normal exam.  Repeat in 10 years.  Marland Kitchen PROSTATE SURGERY      FAMILY HISTORY: The patient family history includes Diabetes in his mother; Heart attack in his brother;  Stroke in his father.  SOCIAL HISTORY:  The patient  reports that he has never smoked. He has never used smokeless tobacco. He reports that he does not drink alcohol and does not use drugs.  REVIEW OF SYSTEMS: Review of Systems  Constitutional: Negative for chills and fever.  HENT: Negative for hoarse voice and nosebleeds.   Eyes: Negative for discharge, double vision and pain.  Cardiovascular: Positive for near-syncope (history of but no recent episodes.). Negative for chest pain, claudication, dyspnea on exertion, leg swelling, orthopnea, palpitations, paroxysmal nocturnal dyspnea and syncope.  Respiratory: Negative for hemoptysis and shortness of breath.   Musculoskeletal: Negative for muscle cramps and myalgias.  Gastrointestinal: Negative for abdominal pain, constipation, diarrhea, hematemesis, hematochezia, melena, nausea and vomiting.  Neurological: Negative for dizziness and light-headedness.    PHYSICAL EXAM: Vitals with BMI 06/21/2020 05/01/2020 08/03/2018  Height 5\' 6"  5\' 6"  5\' 6"   Weight 186 lbs 190 lbs 192 lbs  BMI 16.01 09.32 31  Systolic 355 732 202  Diastolic 68 78 74  Pulse 69 68 71    CONSTITUTIONAL: Well-developed and well-nourished. No acute distress.  SKIN: Skin is warm and dry. No rash noted. No cyanosis. No pallor. No jaundice HEAD: Normocephalic and atraumatic.  EYES: No scleral icterus MOUTH/THROAT: Moist oral membranes.  NECK: No JVD present. No thyromegaly noted. No carotid bruits  LYMPHATIC: No visible cervical adenopathy.  CHEST Normal respiratory effort. No intercostal retractions  LUNGS: Clear to auscultation bilaterally.  No stridor. No wheezes. No rales.  CARDIOVASCULAR: Regular rate and rhythm, positive S1-S2, no murmurs rubs or gallops appreciated ABDOMINAL: Soft, nontender, nondistended, positive bowel sounds in all quadrants.  No apparent ascites.  EXTREMITIES: No peripheral edema  HEMATOLOGIC: No significant bruising NEUROLOGIC: Oriented to  person, place, and time. Nonfocal. Normal muscle tone.  PSYCHIATRIC: Normal mood and affect. Normal behavior. Cooperative  CARDIAC DATABASE: EKG: 05/01/2020: Sinus  Rhythm, 66bpm. Normal axis. Poor R wave progression Frequent PACs. Left atrial enlargement.   Echocardiogram: 05/09/2020: LVEF 60-65%, mild LVH, normal diastolic filling pattern, normal left atrial pressure, mild MR, mild TR, no pulmonary hypertension, mild to moderate PR.  Stress Testing: Exercise treadmill stress test 05/21/2020:  Exercise treadmill stress test performed using Bruce protocol. Patient reached 7 METS, and 95% of age predicted maximum heart rate. Exercise capacity was low. No chest pain reported. Normal heart rate and hemodynamic response.  Stress EKG revealed ischemic changes with sinus tachycardia, >2 mm downsloping ST depressions in leads II, III, avF, 1.5-2 mm upsloping ST depressions in leads V4-V6. Changes nearly resolved at 2 min into recovery.  Intermediate risk stress test.  7 day extended Holter monitor: Dominant rhythm normal sinus rhythm, followed by supraventricular ectopy (9% burden).  Heart rate 51-140 bpm.  Average heart rate 74 bpm. No atrial fibrillation/atrial flutter/supraventricular tachycardia/ventricular tachycardia/high grade AV block, sinus pause greater than or equal to 3 seconds in duration. Ventricular ectopy 34 beats, total ventricular ectopic burden <0.01%. Supraventricular ectopy 56,672 beats, with total supraventricular ectopic burden 9.02%. Number of patient triggered events: 2.  Underlying rhythm is normal sinus without any significant dysrhythmias.  LABORATORY DATA: CBC Latest Ref Rng & Units 09/15/2014 04/07/2009  WBC 3.4 - 10.8 x10E3/uL 5.1 5.8  Hemoglobin 12.6 - 17.7 g/dL 14.4 13.8  Hematocrit 37 - 51 % 44.9 40.5  Platelets 150 - 379 x10E3/uL 257 238    CMP Latest Ref Rng & Units 09/15/2014 04/07/2009  Glucose 65 - 99 mg/dL 89 120(H)  BUN 8 - 27 mg/dL 13 14   Creatinine 0.76 - 1.27 mg/dL 1.07 0.96  Sodium 134 - 144 mmol/L 139 141  Potassium 3.5 - 5.2 mmol/L 4.5 3.7  Chloride 97 - 108 mmol/L 97 103  CO2 18 - 29 mmol/L 26 30  Calcium 8.6 - 10.2 mg/dL 9.5 9.1  Total Protein 6.0 - 8.5 g/dL 7.6 7.5  Total Bilirubin 0.0 - 1.2 mg/dL 0.4 0.9  Alkaline Phos 39 - 117 IU/L 85 82  AST 0 - 40 IU/L 27 45(H)  ALT 0 - 44 IU/L 29 42    Lipid Panel  No results found for: CHOL, TRIG, HDL, CHOLHDL, VLDL, LDLCALC, LDLDIRECT, LABVLDL  No components found for: NTPROBNP No results for input(s): PROBNP in the last 8760 hours. No results for input(s): TSH in the last 8760 hours.  BMP No results for input(s): NA, K, CL, CO2, GLUCOSE, BUN, CREATININE, CALCIUM, GFRNONAA, GFRAA in the last 8760 hours.  HEMOGLOBIN A1C Lab Results  Component Value Date   HGBA1C 6.5 (H) 09/15/2014    Cardiac Panel (last 3 results) No results for input(s): CKTOTAL, CKMB, TROPONINIHS, RELINDX in the last 72 hours.  CHOLESTEROL No results for input(s): CHOL in the last 8760 hours.  Hepatic Function Panel No results for input(s): PROT, ALBUMIN, AST, ALT, ALKPHOS, BILITOT, BILIDIR, IBILI in the last 8760 hours.   External Labs: Collected: [facility].  Creatinine 0.92 mg/dL. TSH: 2.117  IMPRESSION:    ICD-10-CM   1. Abnormal stress test  R94.39 PCV MYOCARDIAL PERFUSION WITH LEXISCAN  2. Family history of premature coronary artery disease  Z82.49 PCV MYOCARDIAL PERFUSION WITH LEXISCAN    CT CARDIAC SCORING  3. Palpitations  R00.2   4. PSVT (paroxysmal supraventricular tachycardia) (HCC)  I47.1 metoprolol succinate (TOPROL XL) 25 MG 24 hr tablet  5. PAC (premature atrial contraction)  I49.1   6. Prediabetes  R73.03   7. Near syncope  R55      RECOMMENDATIONS: Stanley Bass is a 74 y.o. male whose past medical history and cardiac risk factors include: Prediabetes, history of near syncope, iron deficiency anemia, advanced age, family history of  premature coronary artery disease.   Abnormal exercise treadmill stress test:  Given the patient's multiple cardiovascular risk factors and premature coronary artery disease recommended proceeding with either nuclear stress test or coronary CTA.  Patient would like to proceed with nuclear stress test at this time.  Nuclear stress test recommended to evaluate for reversible ischemia.  Coronary artery calcification score for further with stratification.  We will start Toprol-XL 25 mg p.o. daily  Family history of premature CAD: See above.  Patient states that he recently had blood work done at his PCPs office.  We will try to obtain records to see what his most recent lipid profile is.  Palpitations: Improving  Patient was noted to have a total supraventricular ectopic burden of approximately 9% on a 7-day Holter monitor.  We will start Toprol-XL 25 mg p.o. daily.  Monitor for now.    Patient states that his symptoms of palpitation have improved significantly since the supplementation of iron.  Premature atrial contractions: See above  Prediabetes: Educated on importance of lifestyle modification.  Currently managed by primary team.  FINAL MEDICATION LIST END OF ENCOUNTER: Meds ordered this encounter  Medications  . metoprolol succinate (TOPROL XL) 25 MG 24 hr tablet    Sig: Take 1 tablet (25 mg total) by mouth in the morning. Hold if systolic blood pressure (top blood pressure number) less than 100 mmHg or heart rate less than 60 bpm (pulse).    Dispense:  90 tablet    Refill:  0     Current Outpatient Medications:  .  aspirin EC 81 MG tablet, Take 81 mg by mouth daily., Disp: , Rfl:  .  Ferrous Sulfate (IRON) 325 (65 Fe) MG TABS, Take by mouth daily., Disp: , Rfl:  .  Magnesium 500 MG TABS, Take 500 mg by mouth daily., Disp: , Rfl:  .  Multiple Vitamin (MULTIVITAMIN WITH MINERALS) TABS tablet, Take 1 tablet by mouth daily. Men's 50+, Disp: , Rfl:  .  vitamin B-12  (CYANOCOBALAMIN) 1000 MCG tablet, Take 1,000 mcg by mouth every other day. , Disp: , Rfl:  .  metoprolol succinate (TOPROL XL) 25 MG 24 hr tablet, Take 1 tablet (25 mg total) by mouth in the morning. Hold if systolic blood pressure (top blood pressure number) less than 100 mmHg or heart rate less than 60 bpm (pulse)., Disp: 90 tablet, Rfl: 0 .  VIRTUSSIN A/C 100-10 MG/5ML syrup, Take 5-10 mLs by mouth every 4 (four) hours as needed., Disp: , Rfl:   Orders Placed This Encounter  Procedures  . CT CARDIAC SCORING  . PCV MYOCARDIAL PERFUSION WITH LEXISCAN   There are no Patient Instructions on file for this visit.   --Continue cardiac medications as reconciled in final medication list. --Return in about 4 weeks (around 07/19/2020) for review test results.. Or sooner if needed. --Continue follow-up with your primary care physician regarding the management of your other chronic comorbid conditions.  Patient's questions and concerns were addressed to his satisfaction. He voices understanding of the instructions provided during this encounter.   This note was created using a voice recognition software as a result there may be grammatical errors inadvertently enclosed that do not reflect the nature of this encounter. Every attempt is made to correct such errors.  Rex Kras, [place], [facility]  Pager: [phone] Office: [phone]

## 2020-06-24 NOTE — Progress Notes (Signed)
Coronary artery calcification scoring performed on 06/21/2020 at [facility]: Left main: 72 Left anterior descending: 172 Left circumflex: 1 Right coronary artery: 0 Total calcium score: 245 AU, places the patient between the 25th and the 50th percentile for male patient this age.  Staff: Please inform the patient that he does have coronary artery calcification and should follow-up with his schedule stress test and follow-up visit.  Please remind the patient to bring the medication bottles at the next office visit.

## 2020-06-26 ENCOUNTER — Telehealth: Payer: Self-pay

## 2020-06-26 NOTE — Telephone Encounter (Signed)
Called pt to inform him about his lab results. Pt mention he cancelled all the appt he has with [place].

## 2020-06-26 NOTE — Telephone Encounter (Signed)
-----   Message from [place], [place] sent at 06/24/2020 10:25 PM EDT ----- Coronary artery calcification scoring performed on 06/21/2020 at [facility]: Left main: 72 Left anterior descending: 172 Left circumflex: 1 Right coronary artery: 0 Total calcium score: 245 AU, places the patient between the 25th and the 50th percentile for male patient this age.  Staff: Please inform the patient that he does have coronary artery calcification and should follow-up with his schedule stress test and follow-up visit.  Please remind the patient to bring the medication bottles at the next office visit.

## 2020-06-26 NOTE — Telephone Encounter (Signed)
Called the patient to talk to him regarding this but did not pick up will call him later.

## 2020-07-02 ENCOUNTER — Other Ambulatory Visit: Payer: Medicare HMO

## 2020-07-09 ENCOUNTER — Ambulatory Visit: Payer: Medicare HMO

## 2020-07-09 ENCOUNTER — Other Ambulatory Visit: Payer: Self-pay

## 2020-07-09 DIAGNOSIS — Z8249 Family history of ischemic heart disease and other diseases of the circulatory system: Secondary | ICD-10-CM

## 2020-07-09 DIAGNOSIS — R9439 Abnormal result of other cardiovascular function study: Secondary | ICD-10-CM

## 2020-07-12 DIAGNOSIS — E785 Hyperlipidemia, unspecified: Secondary | ICD-10-CM | POA: Diagnosis not present

## 2020-07-12 NOTE — Telephone Encounter (Signed)
Pt seen

## 2020-07-17 ENCOUNTER — Ambulatory Visit: Payer: Medicare HMO | Admitting: Cardiology

## 2020-07-17 ENCOUNTER — Encounter: Payer: Self-pay | Admitting: Cardiology

## 2020-07-17 ENCOUNTER — Other Ambulatory Visit: Payer: Self-pay

## 2020-07-17 VITALS — BP 131/75 | HR 71 | Resp 16 | Ht 66.0 in | Wt 192.8 lb

## 2020-07-17 DIAGNOSIS — D509 Iron deficiency anemia, unspecified: Secondary | ICD-10-CM

## 2020-07-17 DIAGNOSIS — I2510 Atherosclerotic heart disease of native coronary artery without angina pectoris: Secondary | ICD-10-CM | POA: Diagnosis not present

## 2020-07-17 DIAGNOSIS — I471 Supraventricular tachycardia, unspecified: Secondary | ICD-10-CM

## 2020-07-17 DIAGNOSIS — R7303 Prediabetes: Secondary | ICD-10-CM

## 2020-07-17 DIAGNOSIS — Z712 Person consulting for explanation of examination or test findings: Secondary | ICD-10-CM | POA: Diagnosis not present

## 2020-07-17 DIAGNOSIS — I2584 Coronary atherosclerosis due to calcified coronary lesion: Secondary | ICD-10-CM

## 2020-07-17 DIAGNOSIS — I491 Atrial premature depolarization: Secondary | ICD-10-CM

## 2020-07-17 DIAGNOSIS — Z8249 Family history of ischemic heart disease and other diseases of the circulatory system: Secondary | ICD-10-CM

## 2020-07-17 DIAGNOSIS — R002 Palpitations: Secondary | ICD-10-CM | POA: Diagnosis not present

## 2020-07-17 MED ORDER — ATORVASTATIN CALCIUM 20 MG PO TABS: 40.0000 mg | ORAL_TABLET | Freq: Every day | ORAL | 0 refills | Status: DC

## 2020-07-17 MED ORDER — DILTIAZEM HCL ER COATED BEADS 120 MG PO CP24: 120.0000 mg | ORAL_CAPSULE | Freq: Every morning | ORAL | 0 refills | Status: DC

## 2020-07-17 NOTE — Progress Notes (Signed)
Date:  07/17/2020   ID:  Stanley Bass, DOB Oct 12, 1946, MRN [id number]  PCP:  [name], MD  Cardiologist:  Rex Kras, DO, [facility] (established care 05/01/2020)  Date: 07/17/20 Last Office Visit: 06/21/2020  Chief Complaint  Patient presents with  . Results  . Follow-up    HPI  Stanley Bass is a 74 y.o. male who presents to the office with a chief complaint of "review test results and reevaluation of palpitations." Patient's past medical history and cardiac risk factors include: Prediabetes, history of near syncope, iron deficiency anemia, advanced age, family history of premature coronary artery disease, coronary artery calcification.  Patient is accompanied by his wife Stanley Bass at today's office visit.  Patient was referred to the office at the request of his primary care provider for cardiac evaluation given his cardiovascular risk factors and his recent episodes of palpitation and near syncope.  Patient has been having symptoms of palpitations and near syncope in the past and noncardiac work-up noted that he had iron deficiency anemia and has been started on iron supplement and since then near syncope has not resurfaced.  But he continued to have palpitations and was referred to cardiology for further evaluation and management.    Patient underwent a 7-day extended Holter monitor which noted a supraventricular ectopic burden approximately 9%.  As result he was started on Toprol-XL at the last office visit.  Patient states that he tolerated the medication well overall but started having vivid dreams and chooses not to be on that medication.  In addition, he also underwent an exercise treadmill stress test which was reported to be abnormal as the stress ECG noted ischemic changes.   At the last office visit he was recommended to undergo coronary artery calcification for further risk ratification and undergo nuclear stress test to evaluate for reversible ischemia.  Patient did have  coronary artery calcification score done at [facility] and the total score was 245 consistent with moderate coronary artery calcification.  His nuclear stress test noted normal myocardial perfusion and overall low risk study.  These findings were reviewed with the patient in great detail at today's office visit.  Father had Stroke at age of 68 and brother had MI at age of 13.   Denies prior history of  myocardial infarction, congestive heart failure, deep venous thrombosis, pulmonary embolism, stroke, transient ischemic attack.  FUNCTIONAL STATUS: Enjoys playing golf and does yardwork. He walks atleast 3-36mles a day.   ALLERGIES: Allergies  Allergen Reactions  . Shellfish-Derived Products Hives  . Penicillins Rash    Has patient had a PCN reaction causing immediate rash, facial/tongue/throat swelling, SOB or lightheadedness with hypotension: Unknown Has patient had a PCN reaction causing severe rash involving mucus membranes or skin necrosis:Unknown Has patient had a PCN reaction that required hospitalization: No Has patient had a PCN reaction occurring within the last 10 years: No If all of the above answers are "NO", then may proceed with Cephalosporin use.     MEDICATION LIST PRIOR TO VISIT: Current Meds  Medication Sig  . aspirin EC 81 MG tablet Take 81 mg by mouth daily.  . Ferrous Sulfate (IRON) 325 (65 Fe) MG TABS Take by mouth daily.  . Magnesium 500 MG TABS Take 500 mg by mouth daily.  . Multiple Vitamin (MULTIVITAMIN WITH MINERALS) TABS tablet Take 1 tablet by mouth daily. Men's 50+  . vitamin B-12 (CYANOCOBALAMIN) 1000 MCG tablet Take 1,000 mcg by mouth every other day.   . [DISCONTINUED]  metoprolol succinate (TOPROL XL) 25 MG 24 hr tablet Take 1 tablet (25 mg total) by mouth in the morning. Hold if systolic blood pressure (top blood pressure number) less than 100 mmHg or heart rate less than 60 bpm (pulse).     PAST MEDICAL HISTORY: Past Medical History:  Diagnosis Date   . Family history of premature CAD   . Prediabetes   . Prostate cancer ([place]) 2011  . Vertigo     PAST SURGICAL HISTORY: Past Surgical History:  Procedure Laterality Date  . COLONOSCOPY N/A 05/08/2017   Procedure: COLONOSCOPY;  Surgeon: Daneil Dolin, MD; normal exam.  Repeat in 10 years.  Marland Kitchen PROSTATE SURGERY      FAMILY HISTORY: The patient family history includes Diabetes in his mother; Heart attack in his brother; Stroke in his father.  SOCIAL HISTORY:  The patient  reports that he has never smoked. He has never used smokeless tobacco. He reports that he does not drink alcohol and does not use drugs.  REVIEW OF SYSTEMS: Review of Systems  Constitutional: Negative for chills and fever.  HENT: Negative for hoarse voice and nosebleeds.   Eyes: Negative for discharge, double vision and pain.  Cardiovascular: Positive for palpitations. Negative for chest pain, claudication, dyspnea on exertion, leg swelling, near-syncope, orthopnea, paroxysmal nocturnal dyspnea and syncope.  Respiratory: Negative for hemoptysis and shortness of breath.   Musculoskeletal: Negative for muscle cramps and myalgias.  Gastrointestinal: Negative for abdominal pain, constipation, diarrhea, hematemesis, hematochezia, melena, nausea and vomiting.  Neurological: Negative for dizziness and light-headedness.    PHYSICAL EXAM: Vitals with BMI 07/17/2020 06/21/2020 05/01/2020  Height _0  _1  _2   Weight 192 lbs 13 oz 186 lbs 190 lbs  BMI 31.13 16.07 37.10  Systolic 626 948 546  Diastolic 75 68 78  Pulse 71 69 68    CONSTITUTIONAL: Well-developed and well-nourished. No acute distress.  SKIN: Skin is warm and dry. No rash noted. No cyanosis. No pallor. No jaundice HEAD: Normocephalic and atraumatic.  EYES: No scleral icterus MOUTH/THROAT: Moist oral membranes.  NECK: No JVD present. No thyromegaly noted. No carotid bruits  LYMPHATIC: No visible cervical adenopathy.  CHEST Normal respiratory effort. No  intercostal retractions  LUNGS: Clear to auscultation bilaterally.  No stridor. No wheezes. No rales.  CARDIOVASCULAR: Regular rate and rhythm, positive S1-S2, no murmurs rubs or gallops appreciated ABDOMINAL: Soft, nontender, nondistended, positive bowel sounds in all quadrants.  No apparent ascites.  EXTREMITIES: No peripheral edema  HEMATOLOGIC: No significant bruising NEUROLOGIC: Oriented to person, place, and time. Nonfocal. Normal muscle tone.  PSYCHIATRIC: Normal mood and affect. Normal behavior. Cooperative  CARDIAC DATABASE: EKG: 05/01/2020: Sinus  Rhythm, 66bpm. Normal axis. Poor R wave progression Frequent PACs. Left atrial enlargement.   Echocardiogram: 05/09/2020: LVEF 60-65%, mild LVH, normal diastolic filling pattern, normal left atrial pressure, mild MR, mild TR, no pulmonary hypertension, mild to moderate PR.  Stress Testing: Exercise treadmill stress test 05/21/2020:  Exercise treadmill stress test performed using Bruce protocol. Patient reached 7 METS, and 95% of age predicted maximum heart rate. Exercise capacity was low. No chest pain reported. Normal heart rate and hemodynamic response.  Stress EKG revealed ischemic changes with sinus tachycardia, >2 mm downsloping ST depressions in leads II, III, avF, 1.5-2 mm upsloping ST depressions in leads V4-V6. Changes nearly resolved at 2 min into recovery.  Intermediate risk stress test.   Lexiscan/modified Bruce Tetrofosmin stress test 07/09/2020: Lexiscan/modified Bruce nuclear stress test performed using 1-day protocol. Stress EKG  is non-diagnostic, as this is pharmacological stress test. In addition, stress EKG at 71% [facility] showed sinus tachycardia, possible old inferior infarct,  isolated 1 mm horizontal ST depression in lead II.  Normal myocardial perfusion. Stress LVEF 67%. Low risk study.  Coronary artery calcification scoring performed on 06/21/2020 at [facility]:  Left main: 72 Left anterior descending:  172 Left circumflex: 1 Right coronary artery: 0 Total calcium score: 245 AU, places the patient between the 25th and the 50th percentile for male patient this age.  7 day extended Holter monitor: Dominant rhythm normal sinus rhythm, followed by supraventricular ectopy (9% burden).  Heart rate 51-140 bpm.  Average heart rate 74 bpm. No atrial fibrillation/atrial flutter/supraventricular tachycardia/ventricular tachycardia/high grade AV block, sinus pause greater than or equal to 3 seconds in duration. Ventricular ectopy 34 beats, total ventricular ectopic burden <0.01%. Supraventricular ectopy 56,672 beats, with total supraventricular ectopic burden 9.02%. Number of patient triggered events: 2.  Underlying rhythm is normal sinus without any significant dysrhythmias.  LABORATORY DATA: External Labs: Collected: [facility].  Creatinine 0.92 mg/dL. TSH: 2.117  IMPRESSION:    ICD-10-CM   1. Coronary atherosclerosis due to calcified coronary lesion of native artery  I25.10 Lipid Panel With LDL/HDL Ratio   I25.84 CMP14+EGFR    atorvastatin (LIPITOR) 20 MG tablet  2. PAC (premature atrial contraction)  I49.1 diltiazem (CARDIZEM CD) 120 MG 24 hr capsule  3. PSVT (paroxysmal supraventricular tachycardia) (HCC)  I47.1   4. Family history of premature coronary artery disease  Z82.49   5. Palpitations  R00.2 diltiazem (CARDIZEM CD) 120 MG 24 hr capsule  6. Prediabetes  R73.03   7. Iron deficiency anemia, unspecified iron deficiency anemia type  D50.9   8. Encounter to discuss test results  Z71.2      RECOMMENDATIONS: Stanley Bass is a 74 y.o. male whose past medical history and cardiac risk factors include: Prediabetes, history of near syncope, iron deficiency anemia, advanced age, coronary artery calcification, family history of premature coronary artery disease.   Coronary artery calcification:  Total coronary artery calcification score 245 AU, consistent with  moderate calcification.  Continue aspirin 81 mg p.o. daily  Start Lipitor 20 mg p.o. nightly.   No recent lipid profile to review.  Check fasting lipid profile and CMP.  Patient states that he will have this done at his PCPs office.  I informed the patient that I will send both a fasting lipid profile and CMP to [facility] just in case if he is unable to get it done at his PCPs office.  Recommend a repeat fasting lipid profile in 3 months.  Abnormal exercise treadmill stress test:  Nuclear stress test shows normal perfusion and overall low risk study.  Given moderate coronary artery calcification would recommend improving his modifiable cardiovascular risk factors.  Continue aspirin 81 mg p.o. daily  We will start Lipitor 20 mg p.o. nightly.  Family history of premature CAD: See above.  Palpitations: Improving  Total supraventricular ectopic burden of approximately 9% on a 7-day Holter monitor.    Patient was started on Toprol-XL 25 mg p.o. daily.  Patient tolerated the medication well but states that he was having vivid dreams at times.    Discontinue Toprol-XL.    Start Cardizem 120 mg p.o. daily   We will continue to monitor.    Premature atrial contractions: See above  Prediabetes: Educated on importance of lifestyle modification.  Currently managed by primary team.  FINAL MEDICATION LIST END  OF ENCOUNTER: Meds ordered this encounter  Medications  . diltiazem (CARDIZEM CD) 120 MG 24 hr capsule    Sig: Take 1 capsule (120 mg total) by mouth in the morning.    Dispense:  90 capsule    Refill:  0  . atorvastatin (LIPITOR) 20 MG tablet    Sig: Take 2 tablets (40 mg total) by mouth at bedtime.    Dispense:  180 tablet    Refill:  0     Current Outpatient Medications:  .  aspirin EC 81 MG tablet, Take 81 mg by mouth daily., Disp: , Rfl:  .  Ferrous Sulfate (IRON) 325 (65 Fe) MG TABS, Take by mouth daily., Disp: , Rfl:  .  Magnesium 500 MG TABS, Take 500 mg by  mouth daily., Disp: , Rfl:  .  Multiple Vitamin (MULTIVITAMIN WITH MINERALS) TABS tablet, Take 1 tablet by mouth daily. Men's 50+, Disp: , Rfl:  .  vitamin B-12 (CYANOCOBALAMIN) 1000 MCG tablet, Take 1,000 mcg by mouth every other day. , Disp: , Rfl:  .  atorvastatin (LIPITOR) 20 MG tablet, Take 2 tablets (40 mg total) by mouth at bedtime., Disp: 180 tablet, Rfl: 0 .  diltiazem (CARDIZEM CD) 120 MG 24 hr capsule, Take 1 capsule (120 mg total) by mouth in the morning., Disp: 90 capsule, Rfl: 0  Orders Placed This Encounter  Procedures  . Lipid Panel With LDL/HDL Ratio  . CMP14+EGFR   There are no Patient Instructions on file for this visit.   --Continue cardiac medications as reconciled in final medication list. --Return in about 3 months (around 10/17/2020) for CAD and PSVT follow up. Or sooner if needed. --Continue follow-up with your primary care physician regarding the management of your other chronic comorbid conditions.  Patient's questions and concerns were addressed to his satisfaction. He voices understanding of the instructions provided during this encounter.   This note was created using a voice recognition software as a result there may be grammatical errors inadvertently enclosed that do not reflect the nature of this encounter. Every attempt is made to correct such errors.  Rex Kras, [place], [facility]  Pager: [phone] Office: [phone]

## 2020-07-19 ENCOUNTER — Ambulatory Visit: Payer: Medicare HMO | Admitting: Cardiology

## 2020-08-22 DIAGNOSIS — R69 Illness, unspecified: Secondary | ICD-10-CM | POA: Diagnosis not present

## 2020-10-22 ENCOUNTER — Ambulatory Visit: Payer: Medicare HMO | Admitting: Cardiology

## 2020-10-22 DIAGNOSIS — K429 Umbilical hernia without obstruction or gangrene: Secondary | ICD-10-CM | POA: Diagnosis not present

## 2020-10-22 DIAGNOSIS — R7303 Prediabetes: Secondary | ICD-10-CM | POA: Diagnosis not present

## 2020-10-22 DIAGNOSIS — Z0000 Encounter for general adult medical examination without abnormal findings: Secondary | ICD-10-CM | POA: Diagnosis not present

## 2020-10-22 DIAGNOSIS — E785 Hyperlipidemia, unspecified: Secondary | ICD-10-CM | POA: Diagnosis not present

## 2020-10-22 DIAGNOSIS — D509 Iron deficiency anemia, unspecified: Secondary | ICD-10-CM | POA: Diagnosis not present

## 2021-02-26 DIAGNOSIS — K429 Umbilical hernia without obstruction or gangrene: Secondary | ICD-10-CM | POA: Diagnosis not present

## 2021-02-26 DIAGNOSIS — D509 Iron deficiency anemia, unspecified: Secondary | ICD-10-CM | POA: Diagnosis not present

## 2021-02-26 DIAGNOSIS — E785 Hyperlipidemia, unspecified: Secondary | ICD-10-CM | POA: Diagnosis not present

## 2021-02-26 DIAGNOSIS — R7303 Prediabetes: Secondary | ICD-10-CM | POA: Diagnosis not present

## 2021-05-04 DIAGNOSIS — R32 Unspecified urinary incontinence: Secondary | ICD-10-CM | POA: Diagnosis not present

## 2021-05-04 DIAGNOSIS — M48061 Spinal stenosis, lumbar region without neurogenic claudication: Secondary | ICD-10-CM | POA: Diagnosis not present

## 2021-05-04 DIAGNOSIS — R079 Chest pain, unspecified: Secondary | ICD-10-CM | POA: Diagnosis not present

## 2021-05-04 DIAGNOSIS — M4802 Spinal stenosis, cervical region: Secondary | ICD-10-CM | POA: Diagnosis not present

## 2021-05-04 DIAGNOSIS — R9431 Abnormal electrocardiogram [ECG] [EKG]: Secondary | ICD-10-CM | POA: Diagnosis not present

## 2021-05-04 DIAGNOSIS — R0789 Other chest pain: Secondary | ICD-10-CM | POA: Diagnosis not present

## 2021-06-24 DIAGNOSIS — R7309 Other abnormal glucose: Secondary | ICD-10-CM | POA: Diagnosis not present

## 2021-06-24 DIAGNOSIS — G4762 Sleep related leg cramps: Secondary | ICD-10-CM | POA: Diagnosis not present

## 2021-06-24 DIAGNOSIS — K429 Umbilical hernia without obstruction or gangrene: Secondary | ICD-10-CM | POA: Diagnosis not present

## 2021-06-24 DIAGNOSIS — R7303 Prediabetes: Secondary | ICD-10-CM | POA: Diagnosis not present

## 2021-06-24 DIAGNOSIS — E785 Hyperlipidemia, unspecified: Secondary | ICD-10-CM | POA: Diagnosis not present

## 2021-06-24 DIAGNOSIS — D509 Iron deficiency anemia, unspecified: Secondary | ICD-10-CM | POA: Diagnosis not present

## 2021-10-28 DIAGNOSIS — Z0189 Encounter for other specified special examinations: Secondary | ICD-10-CM | POA: Diagnosis not present

## 2021-10-28 DIAGNOSIS — Z0001 Encounter for general adult medical examination with abnormal findings: Secondary | ICD-10-CM | POA: Diagnosis not present

## 2021-10-28 DIAGNOSIS — E785 Hyperlipidemia, unspecified: Secondary | ICD-10-CM | POA: Diagnosis not present

## 2021-10-28 DIAGNOSIS — Z125 Encounter for screening for malignant neoplasm of prostate: Secondary | ICD-10-CM | POA: Diagnosis not present

## 2021-10-28 DIAGNOSIS — R7303 Prediabetes: Secondary | ICD-10-CM | POA: Diagnosis not present

## 2022-02-25 DIAGNOSIS — E785 Hyperlipidemia, unspecified: Secondary | ICD-10-CM | POA: Diagnosis not present

## 2022-02-25 DIAGNOSIS — Z6829 Body mass index (BMI) 29.0-29.9, adult: Secondary | ICD-10-CM | POA: Diagnosis not present

## 2022-02-25 DIAGNOSIS — R7303 Prediabetes: Secondary | ICD-10-CM | POA: Diagnosis not present

## 2022-07-18 DIAGNOSIS — H5213 Myopia, bilateral: Secondary | ICD-10-CM | POA: Diagnosis not present

## 2022-07-18 DIAGNOSIS — Z0100 Encounter for examination of eyes and vision without abnormal findings: Secondary | ICD-10-CM | POA: Diagnosis not present

## 2022-08-12 ENCOUNTER — Ambulatory Visit: Payer: Medicare HMO | Admitting: Cardiology

## 2022-08-12 DIAGNOSIS — R202 Paresthesia of skin: Secondary | ICD-10-CM | POA: Diagnosis not present

## 2022-08-12 DIAGNOSIS — Z0000 Encounter for general adult medical examination without abnormal findings: Secondary | ICD-10-CM | POA: Diagnosis not present

## 2022-08-12 DIAGNOSIS — R7303 Prediabetes: Secondary | ICD-10-CM | POA: Diagnosis not present

## 2022-08-12 DIAGNOSIS — Z6830 Body mass index (BMI) 30.0-30.9, adult: Secondary | ICD-10-CM | POA: Diagnosis not present

## 2022-08-12 DIAGNOSIS — R600 Localized edema: Secondary | ICD-10-CM | POA: Diagnosis not present

## 2022-08-12 DIAGNOSIS — E785 Hyperlipidemia, unspecified: Secondary | ICD-10-CM | POA: Diagnosis not present

## 2022-08-12 DIAGNOSIS — K429 Umbilical hernia without obstruction or gangrene: Secondary | ICD-10-CM | POA: Diagnosis not present

## 2022-09-03 NOTE — Progress Notes (Addendum)
Cardiology Office Note  Date: 09/08/2022   ID: Stanley Bass, DOB 02-07-1946, MRN [id number]  PCP:  [name], MD  Cardiologist:  [name], MD Electrophysiologist:  None   Reason for Office Visit: CAD evaluation per request of Dr Stanley Bass   History of Present Illness: Stanley Bass is a 76 y.o. male known to have HTN, prediabetes, thyroid goiter is referred by PCP for cardiac evaluation of his risk factors. He was originally seen by cardiology in [name] for evaluation of palpitations and presyncope in 2021. Patient reported he was admitted to [name] in 2021 and was diagnosed with severe iron deficiency anemia s/p IV iron transfusions following which he experienced significant relief of his symptoms of palpitations and presyncope. Anemia was found to be secondary to GIB from chronic NSAID use. Upon his discharge, he was sent to see cardiology who placed 7-day event monitor and ordered echocardiogram. The event monitor did not show any arrhythmias/heart block/pauses. Echocardiogram showed normal LVEF and mild to moderate PR. Stress EKG was also ordered that showed 2 mm downsloping ST depressions in the inferior leads but no chest pain. He was called to return for immediate follow-up at the cardiology clinic in 2021. He did not answer/return calls per documentation. He is here as new patient visit. No interval ER visits or hospitalizations. Patient denied any rest or exertional chest discomfort, tightness, heaviness or pressure, rest or exertional dyspnea, palpitations, light-headedness, syncope, claudication and LE swelling. Compliant with medications and no side-effects. No bleeding complications. Patient is physically active at baseline, plays golf, bowling and mows lawn without any symptoms.   Past Medical History:  Diagnosis Date   Family history of premature CAD    Prediabetes    Prostate cancer ([place]) 2011   Vertigo     Past Surgical History:   Procedure Laterality Date   COLONOSCOPY N/A 05/08/2017   Procedure: COLONOSCOPY;  Surgeon: Daneil Dolin, MD; normal exam.  Repeat in 10 years.   PROSTATE SURGERY      Current Outpatient Medications  Medication Sig Dispense Refill   aspirin EC 81 MG tablet Take 81 mg by mouth daily.     Ferrous Sulfate (IRON) 325 (65 Fe) MG TABS Take by mouth daily.     Magnesium 500 MG TABS Take 500 mg by mouth daily.     Multiple Vitamin (MULTIVITAMIN WITH MINERALS) TABS tablet Take 1 tablet by mouth daily. Men's 50+     vitamin B-12 (CYANOCOBALAMIN) 1000 MCG tablet Take 1,000 mcg by mouth every other day.      atorvastatin (LIPITOR) 20 MG tablet Take 2 tablets (40 mg total) by mouth at bedtime. 180 tablet 0   No current facility-administered medications for this visit.   Allergies:  Shellfish-derived products and Penicillins   Social History: The patient  reports that he has never smoked. He has never used smokeless tobacco. He reports that he does not drink alcohol and does not use drugs.   Family History: The patient's family history includes Diabetes in his mother; Heart attack in his brother; Stroke in his father.   ROS:  Please see the history of present illness. Otherwise, complete review of systems is positive for tingling in b/l feet and hands.  All other systems are reviewed and negative.   Physical Exam: VS:  BP 116/74   Pulse 82   Wt 192 lb 12.8 oz (87.5 kg)   SpO2 97%   BMI 31.12 kg/m , BMI  Body mass index is 31.12 kg/m.  Wt Readings from Last 3 Encounters:  09/08/22 192 lb 12.8 oz (87.5 kg)  07/17/20 192 lb 12.8 oz (87.5 kg)  06/21/20 186 lb (84.4 kg)    General: Patient appears comfortable at rest. HEENT: Conjunctiva and lids normal, oropharynx clear with moist mucosa. Neck: Supple, no elevated JVP or carotid bruits, no thyromegaly. Lungs: Clear to auscultation, nonlabored breathing at rest. Cardiac: Regular rate and rhythm, no S3 or significant systolic murmur, no  pericardial rub. Abdomen: Soft, nontender, no hepatomegaly, bowel sounds present, no guarding or rebound. Extremities: No pitting edema, distal pulses 2+. Skin: Warm and dry. Musculoskeletal: No kyphosis. Neuropsychiatric: Alert and oriented x3, affect grossly appropriate.  ECG:  An ECG dated 09/08/2022 was personally reviewed today and demonstrated:  NSR and possible inferior infarct  Recent Labwork: No results found for requested labs within last 365 days.  No results found for: "CHOL", "TRIG", "HDL", "CHOLHDL", "VLDL", "LDLCALC", "LDLDIRECT"  Other Studies Reviewed Today: Echo in 2021 1. Normal LV systolic function with visual EF 60-65%. Left ventricle cavity is normal in size. Mild left ventricular hypertrophy. Normal global wall motion. Normal diastolic filling pattern, normal LAP. 2. Mild (Grade I) mitral regurgitation. 3. Mild tricuspid regurgitation. No evidence of pulmonary hypertension. 4. Mild to moderate pulmonic regurgitation. 5. No prior study for comparison.  Stress EKG in 2021 2 mm downsloping ST depressions in inferior leads  Coronary artery calcification scoring performed on 06/21/2020 at [facility]: Left main: 72 Left anterior descending: 172 Left circumflex: 1 Right coronary artery: 0 Total calcium score: 245 AU, places the patient between the 25th and the 50th percentile for male patient this age.  Assessment and Plan: Patient is a 76 year old male known to have HTN, thyroid goiter is here for a new patient visit.  #Abnormal stress EKG PLAN -Patient had 2 mm downsloping ST depressions during stress in 2021 which resolved around 2 min during recovery. No angina during or after stress. No interval angina or DOE. Patient is physically active at baseline,plays golf, bowling and mows lawn without any symptoms. In the absence of any symptoms, he will not benefit from further cardiac evaluation.  #Coronary artery calcifications PLAN -Continue aspirin '81mg'$   once daily -Continue moderate intensity statin, atorvastatin 20 mg nightly if LDL>100 mg/dL. Obtain CK prior to initiation of statin. Explained the side-effects of statin including muscle aches. Patient had lipid panel checked recently with his PCP but could not find it here.  #HTN, controlled PLAN -Not on any medications, BP controlled  I have spent a total of 30 minutes with patient reviewing chart , EKGs, labs and examining patient as well as establishing an assessment and plan that was discussed with the patient.  > 50% of time was spent in direct patient care.     Medication Adjustments/Labs and Tests Ordered: Current medicines are reviewed at length with the patient today.  Concerns regarding medicines are outlined above.   Tests Ordered: Orders Placed This Encounter  Procedures   CK (Creatine Kinase)   EKG 12-Lead    Medication Changes: Meds ordered this encounter  Medications   atorvastatin (LIPITOR) 20 MG tablet    Sig: Take 2 tablets (40 mg total) by mouth at bedtime.    Dispense:  180 tablet    Refill:  0    Disposition:  Follow up  one year  Signed, Demani Weyrauch Fidel Levy, MD, 09/08/2022 9:40 AM    Bodey Mason at [facility] [address], [place], [place] [address]

## 2022-09-08 ENCOUNTER — Other Ambulatory Visit
Admission: RE | Admit: 2022-09-08 | Discharge: 2022-09-08 | Disposition: A | Discharge status: Home / Self Care | Payer: MEDICARE | Source: Ambulatory Visit | Attending: Internal Medicine | Admitting: Internal Medicine

## 2022-09-08 ENCOUNTER — Encounter: Payer: Self-pay | Admitting: Internal Medicine

## 2022-09-08 ENCOUNTER — Ambulatory Visit (INDEPENDENT_AMBULATORY_CARE_PROVIDER_SITE_OTHER): Payer: MEDICARE | Admitting: Internal Medicine

## 2022-09-08 DIAGNOSIS — R9439 Abnormal result of other cardiovascular function study: Secondary | ICD-10-CM | POA: Diagnosis not present

## 2022-09-08 DIAGNOSIS — I2584 Coronary atherosclerosis due to calcified coronary lesion: Secondary | ICD-10-CM

## 2022-09-08 DIAGNOSIS — I2510 Atherosclerotic heart disease of native coronary artery without angina pectoris: Secondary | ICD-10-CM

## 2022-09-08 LAB — CK: Total CK: 196 U/L (ref 49–397)

## 2022-09-08 MED ORDER — ATORVASTATIN CALCIUM 20 MG PO TABS: 20.0000 mg | ORAL_TABLET | Freq: Every day | ORAL | 0 refills | Status: AC

## 2022-09-08 MED ORDER — ATORVASTATIN CALCIUM 20 MG PO TABS: 40.0000 mg | ORAL_TABLET | Freq: Every day | ORAL | 0 refills | Status: DC

## 2022-09-08 NOTE — Addendum Note (Signed)
Addended by: Vangie Bicker on: 09/08/2022 11:54 AM   Modules accepted: Level of Service

## 2022-09-08 NOTE — Patient Instructions (Signed)
Medication Instructions:  Your physician recommends that you continue on your current medications as directed. Please refer to the Current Medication list given to you today.  -Start Atorvastatin 2 mg tablets if your LDL is above 100.  Labwork: Today: -Creatine Kinase  Testing/Procedures: None  Follow-Up: Follow up with Dr. Dellia Cloud in 1 year.   Any Other Special Instructions Will Be Listed Below (If Applicable).     If you need a refill on your cardiac medications before your next appointment, please call your pharmacy.

## 2022-09-09 ENCOUNTER — Telehealth: Payer: Self-pay

## 2022-09-09 NOTE — Telephone Encounter (Signed)
Patient notified and verified understanding. Patient had no questions or concerns at this time. PCP copied

## 2022-09-09 NOTE — Telephone Encounter (Signed)
-----   Message from [name], MD sent at 09/08/2022  4:13 PM EDT ----- Normal values.

## 2022-10-07 DIAGNOSIS — R202 Paresthesia of skin: Secondary | ICD-10-CM | POA: Diagnosis not present

## 2022-12-02 DIAGNOSIS — E1169 Type 2 diabetes mellitus with other specified complication: Secondary | ICD-10-CM | POA: Diagnosis not present

## 2022-12-02 DIAGNOSIS — I10 Essential (primary) hypertension: Secondary | ICD-10-CM | POA: Diagnosis not present

## 2022-12-02 DIAGNOSIS — E785 Hyperlipidemia, unspecified: Secondary | ICD-10-CM | POA: Diagnosis not present

## 2022-12-02 DIAGNOSIS — Z6830 Body mass index (BMI) 30.0-30.9, adult: Secondary | ICD-10-CM | POA: Diagnosis not present

## 2022-12-02 DIAGNOSIS — R202 Paresthesia of skin: Secondary | ICD-10-CM | POA: Diagnosis not present

## 2022-12-02 DIAGNOSIS — E118 Type 2 diabetes mellitus with unspecified complications: Secondary | ICD-10-CM | POA: Diagnosis not present

## 2023-01-26 DIAGNOSIS — J069 Acute upper respiratory infection, unspecified: Secondary | ICD-10-CM | POA: Diagnosis not present

## 2023-02-02 DIAGNOSIS — R051 Acute cough: Secondary | ICD-10-CM | POA: Diagnosis not present

## 2023-02-23 ENCOUNTER — Other Ambulatory Visit: Payer: Self-pay

## 2023-02-23 ENCOUNTER — Emergency Department (HOSPITAL_COMMUNITY)
Admission: EM | Admit: 2023-02-23 | Discharge: 2023-02-23 | Disposition: A | Discharge status: Home / Self Care | Payer: Medicare HMO | Attending: Student | Admitting: Student

## 2023-02-23 DIAGNOSIS — R791 Abnormal coagulation profile: Secondary | ICD-10-CM | POA: Insufficient documentation

## 2023-02-23 DIAGNOSIS — Z7982 Long term (current) use of aspirin: Secondary | ICD-10-CM | POA: Diagnosis not present

## 2023-02-23 DIAGNOSIS — M79605 Pain in left leg: Secondary | ICD-10-CM | POA: Diagnosis not present

## 2023-02-23 DIAGNOSIS — M7989 Other specified soft tissue disorders: Secondary | ICD-10-CM | POA: Insufficient documentation

## 2023-02-23 DIAGNOSIS — Z6829 Body mass index (BMI) 29.0-29.9, adult: Secondary | ICD-10-CM | POA: Diagnosis not present

## 2023-02-23 DIAGNOSIS — M79662 Pain in left lower leg: Secondary | ICD-10-CM | POA: Diagnosis not present

## 2023-02-23 DIAGNOSIS — E663 Overweight: Secondary | ICD-10-CM | POA: Diagnosis not present

## 2023-02-23 LAB — D-DIMER, QUANTITATIVE: D-Dimer, Quant: 0.69 ug/mL-FEU — ABNORMAL HIGH (ref 0.00–0.50)

## 2023-02-23 MED ORDER — CYCLOBENZAPRINE HCL 10 MG PO TABS: 10.0000 mg | ORAL_TABLET | Freq: Two times a day (BID) | ORAL | 0 refills | Status: DC | PRN

## 2023-02-23 NOTE — Discharge Instructions (Addendum)
You are seen in the emergency department for leg pain.  Thankfully the blood test were ordered which is called a D-dimer which checks for evidence of a blood clot is negative for you on the age-adjusted this test.  This means that there is not likely to be a clot in your leg at this time.  Please manage your symptoms at home as best as he can with over-the-counter pain medication such as Tylenol.  I have also sent a prescription for medication called Flexeril to your pharmacy which she can take as needed for the next few days to alleviate some of the pain in your legs.  This is a muscle relaxer and may cause some fatigue or sedation so would advise taking this at night initially to ensure that you do not have any adverse reactions to this medication.  I will also provide you information for the orthopedic specialist that you can follow-up with if needed if your symptoms or not improving over the next few days.  There may be need for further evaluation and further imaging.

## 2023-02-23 NOTE — ED Triage Notes (Addendum)
Swinging golf club and had pain in the back of left lower leg

## 2023-02-23 NOTE — ED Provider Notes (Signed)
[facility] Provider Note   CSN: [id number] Arrival date & time: 02/23/23  1637     History Chief Complaint  Patient presents with   Leg Pain    Stanley Bass is a 77 y.o. male.  Patient past history significant for osteoarthritis of the knee, shortness of presents emergency department complaints of left calf pain.  He was playing golf earlier today when he swelling and began to experience left calf pain.  Reports he is not having difficulty walking due to significant pain in this area.  Denies feeling that his ankle is unstable.  No prior history of any clots.  Not currently on blood thinners.  Feels that his left lower leg is somewhat swollen compared to the right leg.   Leg Pain      Home Medications Prior to Admission medications   Medication Sig Start Date End Date Taking? Authorizing Provider  cyclobenzaprine (FLEXERIL) 10 MG tablet Take 1 tablet (10 mg total) by mouth 2 (two) times daily as needed for muscle spasms. 02/23/23  Yes Luvenia Heller, PA-C  aspirin EC 81 MG tablet Take 81 mg by mouth daily.    [provider]  atorvastatin (LIPITOR) 20 MG tablet Take 1 tablet (20 mg total) by mouth at bedtime. 09/08/22 12/07/22  Mallipeddi, Vishnu P, MD  Ferrous Sulfate (IRON) 325 (65 Fe) MG TABS Take by mouth daily.    [provider]  Magnesium 500 MG TABS Take 500 mg by mouth daily.    [provider]  Multiple Vitamin (MULTIVITAMIN WITH MINERALS) TABS tablet Take 1 tablet by mouth daily. Men's 50+    [provider]  vitamin B-12 (CYANOCOBALAMIN) 1000 MCG tablet Take 1,000 mcg by mouth every other day.     [provider]      Allergies    Shellfish-derived products and Penicillins    Review of Systems   Review of Systems  Musculoskeletal:  Positive for joint swelling.  All other systems reviewed and are negative.   Physical Exam Updated Vital Signs BP (!) 148/68 (BP Location:  Right Arm)   Pulse 69   Temp 98.1 F (36.7 C) (Temporal)   Resp 18   Ht 5\' 7"  (1.702 m)   Wt 81.6 kg   SpO2 98%   BMI 28.19 kg/m  Physical Exam Vitals and nursing note reviewed.  Constitutional:      General: He is not in acute distress.    Appearance: He is well-developed.  HENT:     Head: Normocephalic and atraumatic.  Eyes:     Conjunctiva/sclera: Conjunctivae normal.  Cardiovascular:     Rate and Rhythm: Normal rate and regular rhythm.     Heart sounds: No murmur heard. Pulmonary:     Effort: Pulmonary effort is normal. No respiratory distress.     Breath sounds: Normal breath sounds.  Abdominal:     Palpations: Abdomen is soft.     Tenderness: There is no abdominal tenderness.  Musculoskeletal:        General: Tenderness present. No swelling. Normal range of motion.     Cervical back: Neck supple.     Comments: Slight swelling of LLE compared to RLE but not considerable or greater than 2x when compared. No significant difference in skin coloration.  Skin:    General: Skin is warm and dry.     Capillary Refill: Capillary refill takes less than 2 seconds.  Neurological:     Mental  Status: He is alert.  Psychiatric:        Mood and Affect: Mood normal.     ED Results / Procedures / Treatments   Labs (all labs ordered are listed, but only abnormal results are displayed) Labs Reviewed  D-DIMER, QUANTITATIVE - Abnormal; Notable for the following components:      Result Value   D-Dimer, Quant 0.69 (*)    All other components within normal limits    EKG None  Radiology No results found.  Procedures Procedures   Medications Ordered in ED Medications - No data to display  ED Course/ Medical Decision Making/ A&P                           Medical Decision Making Amount and/or Complexity of Data Reviewed Labs: ordered.  Risk Prescription drug management.   This patient presents to the ED for concern of left calf pain. Differential diagnosis includes  gastrocnemius strain, DVT, achilles tendon rupture, osteoarthritis of left knee   Lab Tests:  I Ordered, and personally interpreted labs.  The pertinent results include:  D-dimer 0.69 but negative with age adjustment for patient's >64 years old   Problem List / ED Course:  Patient presented to the ED with complaints of left calf pain. He reports pain began after swinging while playing golf earlier today. No prior history of clots in his legs. Not currently on blood thinners. Given concerning location of pain, will order d-dimer to determine if there is an elevated risk for a DVT since ultrasound is no longer available for the evening. D-dimer initially positive but negative when age adjusting for patient's >73 years old. Advised patient of results and no need for [place] or imaging at this time. Encouraged patient to manage symptoms at home with OTC pain medications as tolerated and otherwise monitor for any concerning changes such as leg swelling, skin discoloration, or shortness of breath. Patient agreeable with treatment plan and verbalized understanding all return precautions. All questions answered prior to patient discharged.  Final Clinical Impression(s) / ED Diagnoses Final diagnoses:  Left leg pain    Rx / DC Orders ED Discharge Orders          Ordered    cyclobenzaprine (FLEXERIL) 10 MG tablet  2 times daily PRN        02/23/23 2054              Smitty Knudsen, PA-C 02/24/23 2327    Glendora Score, MD 02/27/23 2223

## 2023-02-25 ENCOUNTER — Ambulatory Visit (INDEPENDENT_AMBULATORY_CARE_PROVIDER_SITE_OTHER): Payer: Medicare HMO | Admitting: Orthopaedic Surgery

## 2023-02-25 ENCOUNTER — Ambulatory Visit (HOSPITAL_COMMUNITY)
Admission: RE | Admit: 2023-02-25 | Discharge: 2023-02-25 | Disposition: A | Discharge status: Home / Self Care | Payer: Medicare HMO | Source: Ambulatory Visit | Attending: Orthopaedic Surgery | Admitting: Orthopaedic Surgery

## 2023-02-25 ENCOUNTER — Encounter: Payer: Self-pay | Admitting: Orthopaedic Surgery

## 2023-02-25 VITALS — BP 134/79 | HR 74 | Ht 67.0 in | Wt 184.0 lb

## 2023-02-25 DIAGNOSIS — M7989 Other specified soft tissue disorders: Secondary | ICD-10-CM | POA: Diagnosis not present

## 2023-02-25 DIAGNOSIS — M79605 Pain in left leg: Secondary | ICD-10-CM

## 2023-02-25 NOTE — Progress Notes (Signed)
Subjective:    Patient ID: Stanley Bass, male    DOB: 03/25/1946, 77 y.o.   MRN: [id number]  HPI He was playing golf on 02-23-23 and swung a club and then developed marked pain of the left calf.  He had swelling and pain.  He stopped playing.  The calf became swollen.  He went to the ER later in the day and was evaluated.  A D-dimer study was done which was interpreted as negative.  He continues to have swelling and pain of the left calf.  He is concerned about a clot.  I will get a stat doppler study.  If the test is negative, he has a strain and will use ice/heat and rubs.  He has Flexeril from the ER.  I have reviewed the ER records.   Review of Systems  Constitutional:  Positive for activity change.  Musculoskeletal:  Positive for arthralgias, gait problem and myalgias.  All other systems reviewed and are negative. For Review of Systems, all other systems reviewed and are negative.  The following is a summary of the past history medically, past history surgically, known current medicines, social history and family history.  This information is gathered electronically by the computer from prior information and documentation.  I review this each visit and have found including this information at this point in the chart is beneficial and informative.   Past Medical History:  Diagnosis Date   Family history of premature CAD    Prediabetes    Prostate cancer 2011   Vertigo     Past Surgical History:  Procedure Laterality Date   COLONOSCOPY N/A 05/08/2017   Procedure: COLONOSCOPY;  Surgeon: Daneil Dolin, MD; normal exam.  Repeat in 10 years.   PROSTATE SURGERY      Current Outpatient Medications on File Prior to Visit  Medication Sig Dispense Refill   aspirin EC 81 MG tablet Take 81 mg by mouth daily.     Ferrous Sulfate (IRON) 325 (65 Fe) MG TABS Take by mouth daily.     hydrochlorothiazide (HYDRODIURIL) 12.5 MG tablet Take 12.5 mg by mouth daily.     Magnesium 500 MG TABS  Take 500 mg by mouth daily.     Multiple Vitamin (MULTIVITAMIN WITH MINERALS) TABS tablet Take 1 tablet by mouth daily. Men's 50+     vitamin B-12 (CYANOCOBALAMIN) 1000 MCG tablet Take 1,000 mcg by mouth every other day.      atorvastatin (LIPITOR) 20 MG tablet Take 1 tablet (20 mg total) by mouth at bedtime. 90 tablet 0   cyclobenzaprine (FLEXERIL) 10 MG tablet Take 1 tablet (10 mg total) by mouth 2 (two) times daily as needed for muscle spasms. (Patient not taking: Reported on 02/25/2023) 20 tablet 0   No current facility-administered medications on file prior to visit.    Social History   Socioeconomic History   Marital status: Married    Spouse name: Blanch Media   Number of children: 3   Years of education: HS   Highest education level: Not on file  Occupational History   Occupation: Retired    Fish farm manager: OTHER  Tobacco Use   Smoking status: Never   Smokeless tobacco: Never  Vaping Use   Vaping Use: Never used  Substance and Sexual Activity   Alcohol use: No   Drug use: No   Sexual activity: Not on file  Other Topics Concern   Not on file  Social History Narrative   Patient lives at home with  his spouse.   Caffeine Use: 1-2 cups daily   Social Determinants of Health   Financial Resource Strain: Not on file  Food Insecurity: Not on file  Transportation Needs: Not on file  Physical Activity: Not on file  Stress: Not on file  Social Connections: Not on file  Intimate Partner Violence: Not on file    Family History  Problem Relation Age of Onset   Diabetes Mother    Stroke Father    Heart attack Brother    Colon cancer Neg Hx     BP 134/79   Pulse 74   Ht 5\' 7"  (1.702 m)   Wt 184 lb (83.5 kg)   BMI 28.82 kg/m   Body mass index is 28.82 kg/m.      Objective:   Physical Exam Vitals and nursing note reviewed. Exam conducted with a chaperone present.  Constitutional:      Appearance: He is well-developed.  HENT:     Head: Normocephalic and atraumatic.   Eyes:     Conjunctiva/sclera: Conjunctivae normal.     Pupils: Pupils are equal, round, and reactive to light.  Cardiovascular:     Rate and Rhythm: Normal rate and regular rhythm.  Pulmonary:     Effort: Pulmonary effort is normal.  Abdominal:     Palpations: Abdomen is soft.  Musculoskeletal:     Cervical back: Normal range of motion and neck supple.       Legs:  Skin:    General: Skin is warm and dry.  Neurological:     Mental Status: He is alert and oriented to person, place, and time.     Cranial Nerves: No cranial nerve deficit.     Motor: No abnormal muscle tone.     Coordination: Coordination normal.     Deep Tendon Reflexes: Reflexes are normal and symmetric. Reflexes normal.  Psychiatric:        Behavior: Behavior normal.        Thought Content: Thought content normal.        Judgment: Judgment normal.           Assessment & Plan:   Encounter Diagnosis  Name Primary?   Pain in left leg Yes   I will get Doppler study to rule out clot.  Return in one week otherwise.  Call if any problem.  Precautions discussed.  Electronically Signed Sanjuana Kava, MD 4/3/20249:37 AM

## 2023-03-04 ENCOUNTER — Ambulatory Visit (INDEPENDENT_AMBULATORY_CARE_PROVIDER_SITE_OTHER): Payer: Medicare HMO | Admitting: Orthopaedic Surgery

## 2023-03-04 ENCOUNTER — Ambulatory Visit: Payer: Medicare HMO | Admitting: Orthopaedic Surgery

## 2023-03-04 ENCOUNTER — Encounter: Payer: Self-pay | Admitting: Orthopaedic Surgery

## 2023-03-04 VITALS — BP 141/75 | HR 84 | Ht 67.0 in | Wt 184.0 lb

## 2023-03-04 DIAGNOSIS — M79605 Pain in left leg: Secondary | ICD-10-CM | POA: Diagnosis not present

## 2023-03-04 NOTE — Progress Notes (Signed)
re

## 2023-03-04 NOTE — Patient Instructions (Signed)
Aspercreme, Biofreeze, Blue Emu or Voltaren Gel over the counter 2-3 times daily. Rub into area well each use for best results.  HEAT THERAPY  Your doctor will tell you how to use heat therapy. In general, you should: Place a towel between your skin and the heat source. Leave the heat on for 20-30 minutes. Your skin may turn pink. Take off the heat if your skin turns bright red. This is very important. If you cannot feel pain, heat, or cold, you have a greater risk of getting burned. Your doctor may also tell you to take a warm water bath. To do this: Put a non-slip pad in the bathtub to prevent a fall. Fill the bathtub with warm water. Check the water temperature. Soak in the water for 15-20 minutes, or as told by your doctor. Be careful when you stand up after the bath. You may feel dizzy. Pat yourself dry after the bath. Do not rub your skin to dry it. General recommendations for heat therapy Be careful not to burn your skin when using heat therapy. High heat or using heat for a long time can cause burns. Do not sleep while using heat therapy. Only use heat therapy while you are awake. Check your skin during heat therapy. Do not use heat therapy if you have a new injury, especially if you have swelling on the injured area. Do not use heat therapy on areas of your skin that are already irritated, such as with a rash or sunburn. Do not use heat therapy if your skin turns bright red.

## 2023-03-04 NOTE — Progress Notes (Signed)
My calf is better.  He had a Doppler study last week which was negative.  I saw the results shortly after the study was complete.  He has had less swelling and pain in the left calf. It is still tender at times. He is getting about better.  Left calf is not very tender today.  NV intact.  Gait is good.  Encounter Diagnosis  Name Primary?   Pain in left leg Yes   Continue heat/ice, rubs.  Return in one week.  If doing well, call and cancel.  Call if any problem.  Precautions discussed.  Electronically Signed Darreld Mclean, MD 4/10/202410:21 AM

## 2023-03-11 ENCOUNTER — Ambulatory Visit: Payer: Medicare HMO | Admitting: Orthopaedic Surgery

## 2023-05-12 DIAGNOSIS — M5459 Other low back pain: Secondary | ICD-10-CM | POA: Diagnosis not present

## 2023-05-12 DIAGNOSIS — M545 Low back pain, unspecified: Secondary | ICD-10-CM | POA: Diagnosis not present

## 2023-05-12 DIAGNOSIS — M5417 Radiculopathy, lumbosacral region: Secondary | ICD-10-CM | POA: Diagnosis not present

## 2023-05-13 ENCOUNTER — Emergency Department (HOSPITAL_COMMUNITY): Payer: Medicare HMO

## 2023-05-13 ENCOUNTER — Ambulatory Visit: Payer: MEDICARE | Attending: Internal Medicine | Admitting: Internal Medicine

## 2023-05-13 ENCOUNTER — Other Ambulatory Visit: Payer: Self-pay

## 2023-05-13 ENCOUNTER — Encounter (HOSPITAL_COMMUNITY): Payer: Self-pay | Admitting: Internal Medicine

## 2023-05-13 ENCOUNTER — Observation Stay (HOSPITAL_COMMUNITY)
Admission: EM | Admit: 2023-05-13 | Discharge: 2023-05-14 | Disposition: A | Discharge status: Home / Self Care | Payer: Medicare HMO | Attending: Internal Medicine | Admitting: Internal Medicine

## 2023-05-13 ENCOUNTER — Encounter: Payer: Self-pay | Admitting: Internal Medicine

## 2023-05-13 VITALS — BP 142/78 | HR 68 | Ht 66.0 in | Wt 190.4 lb

## 2023-05-13 DIAGNOSIS — R079 Chest pain, unspecified: Secondary | ICD-10-CM | POA: Diagnosis not present

## 2023-05-13 DIAGNOSIS — E785 Hyperlipidemia, unspecified: Secondary | ICD-10-CM | POA: Diagnosis not present

## 2023-05-13 DIAGNOSIS — R931 Abnormal findings on diagnostic imaging of heart and coronary circulation: Secondary | ICD-10-CM

## 2023-05-13 DIAGNOSIS — E6609 Other obesity due to excess calories: Secondary | ICD-10-CM

## 2023-05-13 DIAGNOSIS — K219 Gastro-esophageal reflux disease without esophagitis: Secondary | ICD-10-CM | POA: Diagnosis not present

## 2023-05-13 DIAGNOSIS — I10 Essential (primary) hypertension: Secondary | ICD-10-CM | POA: Diagnosis not present

## 2023-05-13 DIAGNOSIS — Z7982 Long term (current) use of aspirin: Secondary | ICD-10-CM | POA: Insufficient documentation

## 2023-05-13 DIAGNOSIS — R0789 Other chest pain: Principal | ICD-10-CM | POA: Insufficient documentation

## 2023-05-13 DIAGNOSIS — Z6830 Body mass index (BMI) 30.0-30.9, adult: Secondary | ICD-10-CM | POA: Diagnosis not present

## 2023-05-13 DIAGNOSIS — R7303 Prediabetes: Secondary | ICD-10-CM | POA: Diagnosis not present

## 2023-05-13 DIAGNOSIS — R9431 Abnormal electrocardiogram [ECG] [EKG]: Secondary | ICD-10-CM

## 2023-05-13 DIAGNOSIS — Z8546 Personal history of malignant neoplasm of prostate: Secondary | ICD-10-CM | POA: Diagnosis not present

## 2023-05-13 DIAGNOSIS — Z79899 Other long term (current) drug therapy: Secondary | ICD-10-CM | POA: Diagnosis not present

## 2023-05-13 LAB — CBC WITH DIFFERENTIAL/PLATELET
Abs Immature Granulocytes: 0.01 10*3/uL (ref 0.00–0.07)
Basophils Absolute: 0 10*3/uL (ref 0.0–0.1)
Basophils Relative: 1 %
Eosinophils Absolute: 0.1 10*3/uL (ref 0.0–0.5)
Eosinophils Relative: 2 %
HCT: 43.3 % (ref 39.0–52.0)
Hemoglobin: 14.5 g/dL (ref 13.0–17.0)
Immature Granulocytes: 0 %
Lymphocytes Relative: 35 %
Lymphs Abs: 1.5 10*3/uL (ref 0.7–4.0)
MCH: 30.3 pg (ref 26.0–34.0)
MCHC: 33.5 g/dL (ref 30.0–36.0)
MCV: 90.4 fL (ref 80.0–100.0)
Monocytes Absolute: 0.3 10*3/uL (ref 0.1–1.0)
Monocytes Relative: 7 %
Neutro Abs: 2.4 10*3/uL (ref 1.7–7.7)
Neutrophils Relative %: 55 %
Platelets: 191 10*3/uL (ref 150–400)
RBC: 4.79 MIL/uL (ref 4.22–5.81)
RDW: 13.1 % (ref 11.5–15.5)
WBC: 4.3 10*3/uL (ref 4.0–10.5)
nRBC: 0 % (ref 0.0–0.2)

## 2023-05-13 LAB — TROPONIN I (HIGH SENSITIVITY)
Troponin I (High Sensitivity): 4 ng/L (ref <18)
Troponin I (High Sensitivity): 4 ng/L (ref <18)

## 2023-05-13 LAB — BASIC METABOLIC PANEL
Anion gap: 6 (ref 5–15)
BUN: 15 mg/dL (ref 8–23)
CO2: 25 mmol/L (ref 22–32)
Calcium: 8.9 mg/dL (ref 8.9–10.3)
Chloride: 103 mmol/L (ref 98–111)
Creatinine, Ser: 0.92 mg/dL (ref 0.61–1.24)
GFR, Estimated: >60 mL/min (ref >60)
Glucose, Bld: 133 mg/dL — ABNORMAL HIGH (ref 70–99)
Potassium: 3.8 mmol/L (ref 3.5–5.1)
Sodium: 134 mmol/L — ABNORMAL LOW (ref 135–145)

## 2023-05-13 LAB — LIPID PANEL
Cholesterol: 183 mg/dL (ref 0–200)
HDL: 40 mg/dL — ABNORMAL LOW (ref >40)
LDL Cholesterol: 106 mg/dL — ABNORMAL HIGH (ref 0–99)
Total CHOL/HDL Ratio: 4.6 RATIO
Triglycerides: 185 mg/dL — ABNORMAL HIGH (ref <150)
VLDL: 37 mg/dL (ref 0–40)

## 2023-05-13 LAB — TSH: TSH: 1.015 u[IU]/mL (ref 0.350–4.500)

## 2023-05-13 LAB — HEMOGLOBIN A1C
Hgb A1c MFr Bld: 5.9 % — ABNORMAL HIGH (ref 4.8–5.6)
Mean Plasma Glucose: 122.63 mg/dL

## 2023-05-13 LAB — BRAIN NATRIURETIC PEPTIDE: B Natriuretic Peptide: 25 pg/mL (ref 0.0–100.0)

## 2023-05-13 MED ORDER — MAGNESIUM OXIDE -MG SUPPLEMENT 400 (240 MG) MG PO TABS
400.0000 mg | ORAL_TABLET | Freq: Every day | ORAL | Status: DC
  Administered 2023-05-13 – 2023-05-14 (×2): 400 mg via ORAL
  Filled 2023-05-13 (×2): qty 1

## 2023-05-13 MED ORDER — SODIUM CHLORIDE 0.9% FLUSH: 3.0000 mL | INTRAVENOUS | Status: DC | PRN

## 2023-05-13 MED ORDER — ADULT MULTIVITAMIN W/MINERALS CH
1.0000 | ORAL_TABLET | Freq: Every day | ORAL | Status: DC
  Administered 2023-05-13 – 2023-05-14 (×2): 1 via ORAL
  Filled 2023-05-13 (×2): qty 1

## 2023-05-13 MED ORDER — SODIUM CHLORIDE 0.9% FLUSH
3.0000 mL | Freq: Two times a day (BID) | INTRAVENOUS | Status: DC
  Administered 2023-05-13 – 2023-05-14 (×3): 3 mL via INTRAVENOUS

## 2023-05-13 MED ORDER — ASPIRIN 81 MG PO CHEW
324.0000 mg | CHEWABLE_TABLET | Freq: Once | ORAL | Status: AC
  Administered 2023-05-13: 324 mg via ORAL
  Filled 2023-05-13: qty 4

## 2023-05-13 MED ORDER — ACETAMINOPHEN 650 MG RE SUPP: 650.0000 mg | Freq: Four times a day (QID) | RECTAL | Status: DC | PRN

## 2023-05-13 MED ORDER — VITAMIN B-12 1000 MCG PO TABS
1000.0000 ug | ORAL_TABLET | ORAL | Status: DC
  Administered 2023-05-14: 1000 ug via ORAL
  Filled 2023-05-13: qty 1

## 2023-05-13 MED ORDER — HYDROCODONE-ACETAMINOPHEN 5-325 MG PO TABS: 1.0000 | ORAL_TABLET | Freq: Three times a day (TID) | ORAL | Status: DC | PRN

## 2023-05-13 MED ORDER — ACETAMINOPHEN 325 MG PO TABS: 650.0000 mg | ORAL_TABLET | Freq: Four times a day (QID) | ORAL | Status: DC | PRN

## 2023-05-13 MED ORDER — ASPIRIN 81 MG PO TBEC
81.0000 mg | DELAYED_RELEASE_TABLET | Freq: Every day | ORAL | Status: DC
  Administered 2023-05-14: 81 mg via ORAL
  Filled 2023-05-13: qty 1

## 2023-05-13 MED ORDER — METHOCARBAMOL 500 MG PO TABS
500.0000 mg | ORAL_TABLET | Freq: Three times a day (TID) | ORAL | Status: DC | PRN
  Administered 2023-05-13: 500 mg via ORAL
  Filled 2023-05-13: qty 1

## 2023-05-13 MED ORDER — ONDANSETRON HCL 4 MG PO TABS: 4.0000 mg | ORAL_TABLET | Freq: Four times a day (QID) | ORAL | Status: DC | PRN

## 2023-05-13 MED ORDER — SODIUM CHLORIDE 0.9 % IV SOLN: 250.0000 mL | INTRAVENOUS | Status: DC | PRN

## 2023-05-13 MED ORDER — ONDANSETRON HCL 4 MG/2ML IJ SOLN: 4.0000 mg | Freq: Four times a day (QID) | INTRAMUSCULAR | Status: DC | PRN

## 2023-05-13 MED ORDER — MORPHINE SULFATE (PF) 2 MG/ML IV SOLN: 2.0000 mg | Freq: Four times a day (QID) | INTRAVENOUS | Status: DC | PRN

## 2023-05-13 MED ORDER — PANTOPRAZOLE SODIUM 40 MG PO TBEC
40.0000 mg | DELAYED_RELEASE_TABLET | Freq: Two times a day (BID) | ORAL | Status: DC
  Administered 2023-05-13 – 2023-05-14 (×3): 40 mg via ORAL
  Filled 2023-05-13 (×3): qty 1

## 2023-05-13 MED ORDER — ENOXAPARIN SODIUM 40 MG/0.4ML IJ SOSY
40.0000 mg | PREFILLED_SYRINGE | INTRAMUSCULAR | Status: DC
  Administered 2023-05-13: 40 mg via SUBCUTANEOUS
  Filled 2023-05-13: qty 0.4

## 2023-05-13 NOTE — Assessment & Plan Note (Signed)
-  Atypical presentation in a patient with heart score of 4. -Telemetry without acute ischemic changes, negative troponin and reassuring 2D echo. -Cardiology service recommending continue the use of aspirin and statin with outpatient follow-up for further evaluation/management. -Given concern for ongoing GERD PPI twice a day initiated. -At discharge patient was chest pain-free and in no acute distress. -A1c 5.9, normal TSH and lipid panel demonstrating dyslipidemia with HDL 40 and triglycerides in the 180 range; LDL was 106.

## 2023-05-13 NOTE — Assessment & Plan Note (Signed)
-  update A1C -modified carb diet discussed with patient

## 2023-05-13 NOTE — H&P (Signed)
History and Physical    Patient: Stanley Bass:[id number] DOB: Nov 16, 1946 DOA: 05/13/2023 DOS: the patient was seen and examined on 05/13/2023 PCP: Mirna Mires, MD  Patient coming from: Home  Chief Complaint:  Chief Complaint  Patient presents with   Chest Pain   HPI: Stanley Bass is a 77 y.o. male with medical history significant of prediabetes, prior history of prostate cancer (s/p surgical resection and currently in remission), class I obesity, hyperlipidemia and family history of coronary artery disease; who presented to the hospital secondary to mid chest/upper epigastric area discomfort.  Patient has been present for the last 2 to 3 days without any specific relief or association with activity.  Patient was seen by his cardiologist on the day of admission who given his history and heart score of 4 transferred to the hospital for further evaluation and management. Patient reports no shortness of breath, no nausea, no vomiting, no dysuria, no hematuria, no melena, no hematochezia or focal deficits.  Negative troponin and chest x-ray demonstrating possible vascular congestion versus atypical infection.  EKG with deep T wave inversion (larger than previous EKG tracings).  Cardiology has been consulted and [facility] contacted to place in the hospital for further evaluation and management.  Review of Systems: As mentioned in the history of present illness. All other systems reviewed and are negative. Past Medical History:  Diagnosis Date   Family history of premature CAD    Prediabetes    Prostate cancer (HCC) 2011   Vertigo    Past Surgical History:  Procedure Laterality Date   COLONOSCOPY N/A 05/08/2017   Procedure: COLONOSCOPY;  Surgeon: Corbin Ade, MD; normal exam.  Repeat in 10 years.   PROSTATE SURGERY     Social History:  reports that he has never smoked. He has never used smokeless tobacco. He reports that he does not drink alcohol and does not use drugs.  Allergies   Allergen Reactions   Shellfish-Derived Products Hives   Penicillins Rash    Has patient had a PCN reaction causing immediate rash, facial/tongue/throat swelling, SOB or lightheadedness with hypotension: Unknown Has patient had a PCN reaction causing severe rash involving mucus membranes or skin necrosis:Unknown Has patient had a PCN reaction that required hospitalization: No Has patient had a PCN reaction occurring within the last 10 years: No If all of the above answers are "NO", then may proceed with Cephalosporin use.     Family History  Problem Relation Age of Onset   Diabetes Mother    Stroke Father    Heart attack Brother    Colon cancer Neg Hx     Prior to Admission medications   Medication Sig Start Date End Date Taking? Authorizing Provider  aspirin EC 81 MG tablet Take 81 mg by mouth daily.   Yes [provider]  cyclobenzaprine (FLEXERIL) 10 MG tablet Take 1 tablet (10 mg total) by mouth 2 (two) times daily as needed for muscle spasms. 02/23/23  Yes Smitty Knudsen, PA-C  Ferrous Sulfate (IRON) 325 (65 Fe) MG TABS Take 1 tablet by mouth daily.   Yes [provider]  HYDROcodone-acetaminophen (NORCO/VICODIN) 5-325 MG tablet Take 1 tablet by mouth 2 (two) times daily as needed. 05/12/23  Yes [provider]  Magnesium 500 MG TABS Take 500 mg by mouth daily.   Yes [provider]  Multiple Vitamin (MULTIVITAMIN WITH MINERALS) TABS tablet Take 1 tablet by mouth daily. Men's 50+   Yes [provider]  predniSONE (  STERAPRED UNI-PAK 21 TAB) 5 MG (21) TBPK tablet Take by mouth as directed. 02/02/23  Yes [provider]  vitamin B-12 (CYANOCOBALAMIN) 1000 MCG tablet Take 1,000 mcg by mouth every other day.    Yes [provider]  atorvastatin (LIPITOR) 20 MG tablet Take 1 tablet (20 mg total) by mouth at bedtime. Patient not taking: Reported on 05/13/2023 09/08/22 12/07/22  Marjo Bicker, MD    Physical  Exam: Vitals:   05/13/23 1430 05/13/23 1605 05/13/23 1612 05/13/23 1625  BP: 136/72  123/73 (!) 161/69  Pulse: (!) 58 62 60 61  Resp: 16 17 18 16   Temp:   98.2 F (36.8 C)   TempSrc:   Oral Oral  SpO2: 99% 96% 100% 100%  Weight:      Height:       General exam: Alert, awake, oriented x 3 Respiratory system: Clear to auscultation. Respiratory effort normal. Cardiovascular system:RRR. No murmurs, rubs, gallops. Gastrointestinal system: Abdomen is nondistended, soft and nontender. No organomegaly or masses felt. Normal bowel sounds heard. Central nervous system: Alert and oriented. No focal neurological deficits. Extremities: No C/C/E, +pedal pulses Skin: No rashes, lesions or ulcers Psychiatry: Judgement and insight appear normal. Mood & affect appropriate.   Data Reviewed: Basic metabolic panel: Sodium 134, potassium 3.8, chloride 103, bicarb 25, glucose 133, BUN 15, creatinine 0.92 CBC: WBCs 4.3, hemoglobin 14.5 and platelet count 191 K BNP: 25 Troponin: 4>>4  Assessment and Plan: * Chest pain -Atypical presentation in a patient with heart score of 4 -Some abnormal EKG changes appreciated at time of admission; at time of my evaluation no chest pain reported. -Patient denies associated shortness of breath or diaphoresis -Troponin negative -Case discussed with cardiology with recommendation for overnight observation to fully cycle troponin and to check 2D echo. -TSH, A1c and lipid panel for restratification has been noted. -Continue aspirin daily.  Prediabetes -update A1C -modified carb diet discussed with patient  Hyperlipidemia -update lipid panel -Healthy diet patient patient has stopped on his own the use of a statin. -Heart healthy discussed with patient.  Class 1 obesity due to excess calories with body mass index (BMI) of 30.0 to 30.9 in adult -Body mass index is 30.73 kg/m. -Low-calorie diet, portion control and increase physical activity discussed with  patient.  GERD (gastroesophageal reflux disease) -Patient has been started on PPI -Lifestyle modification discussed with patient.      Advance Care Planning:   Code Status: Full Code   Consults: Cardiology service  Family Communication: Wife at bedside  Severity of Illness: The appropriate patient status for this patient is OBSERVATION. Observation status is judged to be reasonable and necessary in order to provide the required intensity of service to ensure the patient's safety. The patient's presenting symptoms, physical exam findings, and initial radiographic and laboratory data in the context of their medical condition is felt to place them at decreased risk for further clinical deterioration. Furthermore, it is anticipated that the patient will be medically stable for discharge from the hospital within 2 midnights of admission.   Author: Vassie Loll, MD 05/13/2023 7:12 PM  For on call review [email or web address].

## 2023-05-13 NOTE — Assessment & Plan Note (Signed)
-  Patient has been started on PPI -Lifestyle modification discussed with patient.

## 2023-05-13 NOTE — Patient Instructions (Signed)
PLEASE GO TO THE EMERGENCY DEPARTMENT AT [facility]  Medication Instructions:  Your physician recommends that you continue on your current medications as directed. Please refer to the Current Medication list given to you today.  *If you need a refill on your cardiac medications before your next appointment, please call your pharmacy*   Lab Work: None If you have labs (blood work) drawn today and your tests are completely normal, you will receive your results only by: MyChart Message (if you have MyChart) OR A paper copy in the mail If you have any lab test that is abnormal or we need to change your treatment, we will call you to review the results.   Testing/Procedures: None   Follow-Up: At [facility], you and your health needs are our priority.  As part of our continuing mission to provide you with exceptional heart care, we have created designated Provider Care Teams.  These Care Teams include your primary Cardiologist (physician) and Advanced Practice Providers (APPs -  Physician Assistants and Nurse Practitioners) who all work together to provide you with the care you need, when you need it.  We recommend signing up for the patient portal called "MyChart".  Sign up information is provided on this After Visit Summary.  MyChart is used to connect with patients for Virtual Visits (Telemedicine).  Patients are able to view lab/test results, encounter notes, upcoming appointments, etc.  Non-urgent messages can be sent to your provider as well.   To learn more about what you can do with MyChart, go to [email or web address].    Your next appointment:   1 month(s)  Provider:   Luane School, MD    Other Instructions

## 2023-05-13 NOTE — Progress Notes (Signed)
Cardiology Office Note  Date: 05/13/2023   ID: Stanley Bass, DOB [birth year], MRN [id number]  PCP:  Mirna Mires, MD  Cardiologist:  Marjo Bicker, MD Electrophysiologist:  None   Reason for Office Visit: Follow-up visit for chest pains   History of Present Illness: Stanley Bass is a 77 y.o. male known to have HTN, prediabetes, thyroid goiter is here for follow-up visit.  He was originally seen by cardiology in [facility] for evaluation of palpitations and presyncope in 2021 in the setting of severe anemia. Anemia was found to be secondary to GIB from chronic NSAID use. He underwent event monitor that was unremarkable.  Echocardiogram showed normal LVEF and mild to moderate PR.  Stress EKG showed 2 mm downsloping ST depressions in inferior leads but no chest pain. Normal myocardial perfusion.  He is here for follow-up visit.  He reported that he went to the golf tournament on 05/31/23 last week and had food over there which he did not like. Since then, he started to experience intermittent chest pains substernally.  In addition, his friend who ran with him on 2023-05-31 passed away the next morning in his sleep and this scared the patient.  Hence he wanted to get his heart checked out.  He has been having chest pain since last week 2023/05/31 and has ongoing chest pain during my interview.  He appears to be comfortable.  Denies other symptoms of DOE, dizziness, palpitations, syncope and leg swelling.   Past Medical History:  Diagnosis Date   Family history of premature CAD    Prediabetes    Prostate cancer (HCC) 2011   Vertigo     Past Surgical History:  Procedure Laterality Date   COLONOSCOPY N/A 05/08/2017   Procedure: COLONOSCOPY;  Surgeon: Corbin Ade, MD; normal exam.  Repeat in 10 years.   PROSTATE SURGERY      Current Outpatient Medications  Medication Sig Dispense Refill   aspirin EC 81 MG tablet Take 81 mg by mouth daily.     cyclobenzaprine (FLEXERIL) 10  MG tablet Take 1 tablet (10 mg total) by mouth 2 (two) times daily as needed for muscle spasms. 20 tablet 0   Ferrous Sulfate (IRON) 325 (65 Fe) MG TABS Take by mouth daily.     HYDROcodone-acetaminophen (NORCO/VICODIN) 5-325 MG tablet Take 1 tablet by mouth 2 (two) times daily as needed.     Magnesium 500 MG TABS Take 500 mg by mouth daily.     Multiple Vitamin (MULTIVITAMIN WITH MINERALS) TABS tablet Take 1 tablet by mouth daily. Men's 50+     predniSONE (STERAPRED UNI-PAK 21 TAB) 5 MG (21) TBPK tablet Take by mouth as directed.     vitamin B-12 (CYANOCOBALAMIN) 1000 MCG tablet Take 1,000 mcg by mouth every other day.      atorvastatin (LIPITOR) 20 MG tablet Take 1 tablet (20 mg total) by mouth at bedtime. (Patient not taking: Reported on 05/13/2023) 90 tablet 0   hydrochlorothiazide (HYDRODIURIL) 12.5 MG tablet Take 12.5 mg by mouth daily. (Patient not taking: Reported on 05/13/2023)     No current facility-administered medications for this visit.   Allergies:  Shellfish-derived products and Penicillins   Social History: The patient  reports that he has never smoked. He has never used smokeless tobacco. He reports that he does not drink alcohol and does not use drugs.   Family History: The patient's family history includes Diabetes in his mother; Heart attack in his brother; Stroke in  his father.   ROS:  Please see the history of present illness. Otherwise, complete review of systems is positive for tingling in b/l feet and hands.  All other systems are reviewed and negative.   Physical Exam: VS:  Ht 5\' 6"  (1.676 m)   Wt 190 lb 6.4 oz (86.4 kg)   BMI 30.73 kg/m , BMI Body mass index is 30.73 kg/m.  Wt Readings from Last 3 Encounters:  05/13/23 190 lb 6.4 oz (86.4 kg)  03/04/23 184 lb (83.5 kg)  02/25/23 184 lb (83.5 kg)    General: Patient appears comfortable at rest. HEENT: Conjunctiva and lids normal, oropharynx clear with moist mucosa. Neck: Supple, no elevated JVP or carotid  bruits, no thyromegaly. Lungs: Clear to auscultation, nonlabored breathing at rest. Cardiac: Regular rate and rhythm, no S3 or significant systolic murmur, no pericardial rub. Abdomen: Soft, nontender, no hepatomegaly, bowel sounds present, no guarding or rebound. Extremities: No pitting edema, distal pulses 2+. Skin: Warm and dry. Musculoskeletal: No kyphosis. Neuropsychiatric: Alert and oriented x3, affect grossly appropriate.  ECG:  An ECG dated 09/08/2022 was personally reviewed today and demonstrated:  NSR and possible inferior infarct  Recent Labwork: No results found for requested labs within last 365 days.  No results found for: "CHOL", "TRIG", "HDL", "CHOLHDL", "VLDL", "LDLCALC", "LDLDIRECT"  Other Studies Reviewed Today: Echo in 2021 1. Normal LV systolic function with visual EF 60-65%. Left ventricle cavity is normal in size. Mild left ventricular hypertrophy. Normal global wall motion. Normal diastolic filling pattern, normal LAP. 2. Mild (Grade I) mitral regurgitation. 3. Mild tricuspid regurgitation. No evidence of pulmonary hypertension. 4. Mild to moderate pulmonic regurgitation. 5. No prior study for comparison.  Stress EKG in 2021 2 mm downsloping ST depressions in inferior leads  Coronary artery calcification scoring performed on 06/21/2020 at [facility]: Left main: 72 Left anterior descending: 172 Left circumflex: 1 Right coronary artery: 0 Total calcium score: 245 AU, places the patient between the 25th and the 50th percentile for male patient this age.  Assessment and Plan: Patient is a 77 year old male known to have HTN, thyroid goiter is here for follow-up visit.  # Waxing and waning of chest pain -Started to have waxing and waning of chest pain since last week Saturday night after he had food at the golf tournament.  He has active chest pain during my interview. EKG showed NSR, inferior infarct and no ischemia changes. CT coronary artery calcium  score from 2021 was 245. He had abnormal stress EKG in 2021 (2 mm downsloping ST depressions in the inferior leads) but he had normal myocardial perfusion, study was low risk. Will send patient to the ER to check troponins. If troponins are normal, he will benefit from inpatient stress testing. Dr. Criss Alvine, ER attending is aware of the patient's arrival and cardiology consult team is also aware.  # Elevated coronary artery calcium score 245 -No benefit of aspirin after 77 years old, will discontinue in the next clinic visit.  Will need to be on statin.  #HTN, controlled -Not on any medications.  I have spent a total of 40 minutes with patient reviewing chart , EKGs, labs and examining patient as well as establishing an assessment and plan that was discussed with the patient.  > 50% of time was spent in direct patient care.     Medication Adjustments/Labs and Tests Ordered: Current medicines are reviewed at length with the patient today.  Concerns regarding medicines are outlined above.   Tests Ordered:  Orders Placed This Encounter  Procedures   EKG 12-Lead    Medication Changes: No orders of the defined types were placed in this encounter.   Disposition:  Follow up  one month  Signed, Kamaiyah Uselton Verne Spurr, MD, 05/13/2023 9:16 AM    [facility] at [facility] [address], [place], [place] [address]

## 2023-05-13 NOTE — Assessment & Plan Note (Signed)
-  update lipid panel -Healthy diet patient patient has stopped on his own the use of a statin. -Heart healthy discussed with patient.

## 2023-05-13 NOTE — ED Triage Notes (Signed)
Pt c/o mid chest pain that started Saturday that is sharp in nature. Denies SOB, dizziness

## 2023-05-13 NOTE — Consult Note (Addendum)
Cardiology Consultation   Patient ID: Stanley Bass MRN: [id number]; DOB: 1946-04-09  Admit date: 05/13/2023 Date of Consult: 05/13/2023  PCP:  Mirna Mires, MD   [facility] Providers Cardiologist:  Marjo Bicker, MD        Patient Profile:   Stanley Bass is a 77 y.o. male with a hx of coronary calcification (calcium score of 245 in 05/2020 with abnormal GXT showing > 2 mm ST depression along inferior leads and upsloping ST depression along V4-V6 --> NST showing normal perfusion), HTN, prediabetes, iron-deficiency anemia and family history of CAD who is being seen 05/13/2023 for the evaluation of chest pain at the request of Dr. Criss Alvine.  History of Present Illness:   Stanley Bass presented to our [facility] office today and reported active chest pain, therefore he was sent to the ED for further evaluation.  In talking with the patient today, he reports being in a golf tournament this past Saturday and consumed fried fish and baked beans and noticed epigastric/sternal discomfort afterwards. He reports this was a sharp discomfort and he consumed some soda and belched with resolution of his pain. On Monday, he had a recurrent sensation while golfing and again consumed soda and pain resolved. Says that he felt constipated later in the day and consumed a liquid substance from [facility] (not Maalox or Pepto-Bismol per his report) and he had a bowel movement and felt significant improvement afterwards as well. Since Monday, he reports still having occasional episodes of shooting chest pain but says these only last for a few seconds and spontaneously resolve. He was active around his home yesterday and also had to walk around the court [place] and denies any chest pain or dyspnea on exertion with this. No recent orthopnea, PND or pitting edema.  Initial CBC shows WBC 4.3, Hgb 14.5 and platelets 191. BMET and Troponin I are pending. CXR showing prominent bilateral interstitial opacities which  could reflect pulmonary venous congestion or atypical infection. EKG shows normal sinus rhythm, heart rate 68 with PAC's and TWI along the lateral leads which is more prominent as compared to prior tracings.   Past Medical History:  Diagnosis Date   Family history of premature CAD    Prediabetes    Prostate cancer (HCC) 2011   Vertigo     Past Surgical History:  Procedure Laterality Date   COLONOSCOPY N/A 05/08/2017   Procedure: COLONOSCOPY;  Surgeon: Corbin Ade, MD; normal exam.  Repeat in 10 years.   PROSTATE SURGERY       Home Medications:  Prior to Admission medications   Medication Sig Start Date End Date Taking? Authorizing Provider  aspirin EC 81 MG tablet Take 81 mg by mouth daily.    [provider]  atorvastatin (LIPITOR) 20 MG tablet Take 1 tablet (20 mg total) by mouth at bedtime. Patient not taking: Reported on 05/13/2023 09/08/22 12/07/22  Mallipeddi, Vishnu P, MD  cyclobenzaprine (FLEXERIL) 10 MG tablet Take 1 tablet (10 mg total) by mouth 2 (two) times daily as needed for muscle spasms. 02/23/23   Smitty Knudsen, PA-C  Ferrous Sulfate (IRON) 325 (65 Fe) MG TABS Take by mouth daily.    [provider]  hydrochlorothiazide (HYDRODIURIL) 12.5 MG tablet Take 12.5 mg by mouth daily. Patient not taking: Reported on 05/13/2023    [provider]  HYDROcodone-acetaminophen (NORCO/VICODIN) 5-325 MG tablet Take 1 tablet by mouth 2 (two) times daily as needed. 05/12/23   [provider]  Magnesium 500 MG TABS Take 500 mg by mouth daily.    [provider]  Multiple Vitamin (MULTIVITAMIN WITH MINERALS) TABS tablet Take 1 tablet by mouth daily. Men's 50+    [provider]  predniSONE (STERAPRED UNI-PAK 21 TAB) 5 MG (21) TBPK tablet Take by mouth as directed. 02/02/23   [provider]  vitamin B-12 (CYANOCOBALAMIN) 1000 MCG tablet Take 1,000 mcg by mouth every other day.     [provider]    Inpatient  Medications: Scheduled Meds:  Continuous Infusions:  PRN Meds:   Allergies:    Allergies  Allergen Reactions   Shellfish-Derived Products Hives   Penicillins Rash    Has patient had a PCN reaction causing immediate rash, facial/tongue/throat swelling, SOB or lightheadedness with hypotension: Unknown Has patient had a PCN reaction causing severe rash involving mucus membranes or skin necrosis:Unknown Has patient had a PCN reaction that required hospitalization: No Has patient had a PCN reaction occurring within the last 10 years: No If all of the above answers are "NO", then may proceed with Cephalosporin use.     Social History:   Social History   Socioeconomic History   Marital status: Married    Spouse name: Alona Bene   Number of children: 3   Years of education: HS   Highest education level: Not on file  Occupational History   Occupation: Retired    Associate Professor: [employer]  Tobacco Use   Smoking status: Never   Smokeless tobacco: Never  Vaping Use   Vaping Use: Never used  Substance and Sexual Activity   Alcohol use: No   Drug use: No   Sexual activity: Not on file  Other Topics Concern   Not on file  Social History Narrative   Patient lives at home with his spouse.   Caffeine Use: 1-2 cups daily   Social Determinants of Health   Financial Resource Strain: Not on file  Food Insecurity: Not on file  Transportation Needs: Not on file  Physical Activity: Not on file  Stress: Not on file  Social Connections: Not on file  Intimate Partner Violence: Not on file    Family History:    Family History  Problem Relation Age of Onset   Diabetes Mother    Stroke Father    Heart attack Brother    Colon cancer Neg Hx      ROS:  Please see the history of present illness.   All other ROS reviewed and negative.     Physical Exam/Data:   Vitals:   05/13/23 1042 05/13/23 1100 05/13/23 1130 05/13/23 1145  BP: 131/82 135/63 (!) 131/58 (!) 142/88  Pulse: 68 62 (!) 57 69   Resp: 15 17 15 19   Temp: 98 F (36.7 C)     TempSrc: Oral     SpO2: 95% 100% 97% 95%  Weight:      Height:       No intake or output data in the 24 hours ending 05/13/23 1217    05/13/2023   10:36 AM 05/13/2023    9:14 AM 03/04/2023    9:52 AM  Last 3 Weights  Weight (lbs) 190 lb 6.4 oz 190 lb 6.4 oz 184 lb  Weight (kg) 86.365 kg 86.365 kg 83.462 kg     Body mass index is 30.73 kg/m.  General:  Well nourished, well developed male appearing in no acute distress HEENT: normal Neck: no JVD Vascular: No carotid bruits; Distal pulses 2+ bilaterally Cardiac:  normal  S1, S2; RRR; no murmur  Lungs: no wheezing or rhonchi. Slightly decreased breath sounds along bases.  Abd: soft, nontender, no hepatomegaly  Ext: no pitting edema Musculoskeletal:  No deformities, BUE and BLE strength normal and equal Skin: warm and dry  Neuro:  CNs 2-12 intact, no focal abnormalities noted Psych:  Normal affect   EKG:  The EKG was personally reviewed and demonstrates: NSR, heart rate 68 with PAC's and TWI along the lateral leads which is more prominent as compared to prior tracings.  Telemetry:  Telemetry was personally reviewed and demonstrates:  NSR, HR in 60's to 70's with PAC's.   Relevant CV Studies:  Coronary Calcium Score: 05/2020 FINDINGS:   TOTAL CALCIUM SCORE :   245   LEFT MAIN:72   LAD:172   CIRCUMFLEX: 1   RCA: 0   CARDIAC ANATOMY:Normal   VISUALIZED THORAX:Unremarkable.   IMPRESSION: The total coronary calcium score is 245 which is between the 25th and 50th percentile for a male patient this age.   GXT: 04/2020 Exercise treadmill stress test performed using Bruce protocol.  Patient reached 7 METS, and 95% of age predicted maximum heart rate.  Exercise capacity was low.  No chest pain reported.  Normal heart rate and hemodynamic response.  Stress EKG revealed ischemic changes with sinus tachycardia, >2 mm downsloping ST depressions in leads II, III, avF, 1.5-2 mm upsloping  ST depressions in leads V4-V6. Changes nearly resolved at 2 min into recovery.  Intermediate risk stress test.  NST: 06/2020 Lexiscan/modified Bruce Tetrofosmin stress test 07/09/2020: Lexiscan/modified Bruce nuclear stress test performed using 1-day protocol. Stress EKG is non-diagnostic, as this is pharmacological stress test. In addition, stress EKG at 71% [facility] showed sinus tachycardia, possible old inferior infarct,  isolated 1 mm horizontal ST depression in lead II.  Normal myocardial perfusion. Stress LVEF 67%. Low risk study.  Laboratory Data:  Chemistry Recent Labs  Lab 05/13/23 1140  NA 134*  K 3.8  CL 103  CO2 25  GLUCOSE 133*  BUN 15  CREATININE 0.92  CALCIUM 8.9  GFRNONAA >60  ANIONGAP 6    No results for input(s): "PROT", "ALBUMIN", "AST", "ALT", "ALKPHOS", "BILITOT" in the last 168 hours. Lipids No results for input(s): "CHOL", "TRIG", "HDL", "LABVLDL", "LDLCALC", "CHOLHDL" in the last 168 hours.  Hematology Recent Labs  Lab 05/13/23 1140  WBC 4.3  RBC 4.79  HGB 14.5  HCT 43.3  MCV 90.4  MCH 30.3  MCHC 33.5  RDW 13.1  PLT 191   Thyroid No results for input(s): "TSH", "FREET4" in the last 168 hours.  BNPNo results for input(s): "BNP", "PROBNP" in the last 168 hours.  DDimer No results for input(s): "DDIMER" in the last 168 hours.   Radiology/Studies:  DG Chest 2 View  Result Date: 05/13/2023 CLINICAL DATA:  chest pain EXAM: CHEST - 2 VIEW COMPARISON:  CXR 09/18/17 FINDINGS: No pleural effusion. No pneumothorax. No focal airspace opacity. There are prominent bilateral interstitial opacities that could represent pulmonary venous congestion or atypical infection. Normal cardiac and mediastinal contours. No radiographically apparent displaced rib fractures. Visualized upper abdomen is unremarkable. Vertebral body heights are maintained. IMPRESSION: Prominent bilateral interstitial opacities could represent pulmonary venous congestion or atypical infection.  Electronically Signed   By: Lorenza Cambridge M.D.   On: 05/13/2023 11:37     Assessment and Plan:   1. Atypical Chest Pain/Abnormal EKG - His episodes of chest pain do have atypical qualities as they have occurred at rest or with activity  and only last for seconds and spontaneously resolve. They have also improved with belching or having bowel movements. However, his EKG does show TWI along the lateral leads which is more prominent as compared to prior tracings.  - It is early in his workup and Hs troponin values are pending. Will obtain an echocardiogram to assess for any structural abnormalities. If enzymes are positive, he would require a cardiac catheterization for definitive evaluation. Given his history of an abnormal GXT and prior NST showing normal perfusion, he would likely benefit from a Coronary CTA if enzymes remain negative and echo is reassuring this admission.   2. Coronary Calcification by CT - He did have a calcium score of 245 by imaging in 05/2020 with abnormal GXT but NST showing normal perfusion.  He may require additional cardiac workup as discussed above. Would continue ASA 81 mg daily and Atorvastatin 20 mg daily at the time of admission.  3. HLD - Followed by his PCP. Would continue Atorvastatin 20 mg daily on admission.  4. HTN - Previously on Hydrochlorothiazide but no longer taking. Follow BP this admission.   For questions or updates, please contact [facility] Please consult [email or web address] for contact info under    Signed, Ellsworth Lennox, PA-C  05/13/2023 12:17 PM   Attending note:  Patient seen and examined.  I reviewed his records and discussed the case with Ms. Patrick Jupiter, I agree with her above findings.  Mr. Vangenderen has a history of coronary calcium score 245 in 2021 (LAD predominant) with abnormal GXT but normal myocardial perfusion study at that time managed medically.  He was referred to the ER by Dr. Jenene Slicker after office visit today for  further evaluation of chest pain.  His description of symptoms at this time is fairly atypical as outlined above, potentially GI in origin and not specifically exertional.  He appears comfortable on examination.  Afebrile, heart rate in the 70s in sinus rhythm with PACs by telemetry, blood pressure 148/73.  Lungs are clear.  Cardiac exam with RRR and ectopy, 1/6 systolic murmur.  No peripheral edema.  Pertinent lab work includes potassium 3.8, BUN 15, creatinine 0.92, high-sensitivity troponin I 4, BNP 25, hemoglobin 14.5, platelets 191.  ECG shows sinus rhythm with atrial bigeminy, borderline inferior Q waves with nonspecific ST changes.  Chest x-ray reports interstitial opacities raising possibility of venous congestion versus atypical infection.  He does not describe any cough, no fevers or chills.  Does not have a leukocytosis.  Patient presenting with atypical chest discomfort.  Initial high-sensitivity troponin I and BNP levels are normal.  ECG and chest x-ray show nonspecific abnormalities.  He has known atherosclerosis with a coronary calcium score of 245 in 2021 at which point noninvasive imaging was somewhat equivocal (abnormal GXT but normal perfusion imaging).  Plan is observation overnight to cycle full set of enzymes and get a follow-up echocardiogram.  If results are reassuring he can likely be discharged tomorrow and arrange outpatient cardiac evaluation, most likely coronary CTA for anatomic coronary evaluation.  Jonelle Sidle, M.D., F.A.C.C.

## 2023-05-13 NOTE — ED Provider Notes (Signed)
[place] EMERGENCY DEPARTMENT AT [facility] Provider Note   CSN: [id number] Arrival date & time: 05/13/23  1014     History  Chief Complaint  Patient presents with   Chest Pain    Stanley Bass is a 77 y.o. male.  HPI 77 year old male presents for evaluation of chest pain.  Has a history of hypertension, hyperlipidemia.  He has been having chest pain for about 5 days.  Started off is constant though now is more coming and going.  Primarily feels like a left-sided sharp chest pain.  Started the day he was at a golfing event and was not sure if it was related to the food.  At first it was hurting to move his left arm and his chest and he later took some magnesium citrate to clean out his stool.  The seem to partially help and the pain is better but still coming and going.  No associated shortness of breath.  No leg swelling. Was at the cardiology office this morning and referred here. No exertional symptoms.  Home Medications Prior to Admission medications   Medication Sig Start Date End Date Taking? Authorizing Provider  aspirin EC 81 MG tablet Take 81 mg by mouth daily.   Yes [provider]  cyclobenzaprine (FLEXERIL) 10 MG tablet Take 1 tablet (10 mg total) by mouth 2 (two) times daily as needed for muscle spasms. 02/23/23  Yes Smitty Knudsen, PA-C  Ferrous Sulfate (IRON) 325 (65 Fe) MG TABS Take 1 tablet by mouth daily.   Yes [provider]  HYDROcodone-acetaminophen (NORCO/VICODIN) 5-325 MG tablet Take 1 tablet by mouth 2 (two) times daily as needed. 05/12/23  Yes [provider]  Magnesium 500 MG TABS Take 500 mg by mouth daily.   Yes [provider]  Multiple Vitamin (MULTIVITAMIN WITH MINERALS) TABS tablet Take 1 tablet by mouth daily. Men's 50+   Yes [provider]  predniSONE (STERAPRED UNI-PAK 21 TAB) 5 MG (21) TBPK tablet Take by mouth as directed. 02/02/23  Yes [provider]  vitamin B-12 (CYANOCOBALAMIN)  1000 MCG tablet Take 1,000 mcg by mouth every other day.    Yes [provider]  atorvastatin (LIPITOR) 20 MG tablet Take 1 tablet (20 mg total) by mouth at bedtime. Patient not taking: Reported on 05/13/2023 09/08/22 12/07/22  Mallipeddi, Orion Modest, MD      Allergies    Shellfish-derived products and Penicillins    Review of Systems   Review of Systems  Respiratory:  Negative for shortness of breath.   Cardiovascular:  Positive for chest pain. Negative for leg swelling.  Gastrointestinal:  Negative for abdominal pain.    Physical Exam Updated Vital Signs BP 136/72   Pulse (!) 58   Temp 98 F (36.7 C) (Oral)   Resp 16   Ht 5\' 6"  (1.676 m)   Wt 86.4 kg   SpO2 99%   BMI 30.73 kg/m  Physical Exam Vitals and nursing note reviewed.  Constitutional:      General: He is not in acute distress.    Appearance: He is well-developed. He is not ill-appearing or diaphoretic.  HENT:     Head: Normocephalic and atraumatic.  Cardiovascular:     Rate and Rhythm: Normal rate and regular rhythm.     Heart sounds: Normal heart sounds.  Pulmonary:     Effort: Pulmonary effort is normal.     Breath sounds: Normal breath sounds.  Chest:     Chest  wall: No tenderness.  Abdominal:     Palpations: Abdomen is soft.     Tenderness: There is no abdominal tenderness.  Musculoskeletal:     Right lower leg: No edema.     Left lower leg: No edema.  Skin:    General: Skin is warm and dry.  Neurological:     Mental Status: He is alert.     ED Results / Procedures / Treatments   Labs (all labs ordered are listed, but only abnormal results are displayed) Labs Reviewed  BASIC METABOLIC PANEL - Abnormal; Notable for the following components:      Result Value   Sodium 134 (*)    Glucose, Bld 133 (*)    All other components within normal limits  LIPID PANEL - Abnormal; Notable for the following components:   Triglycerides 185 (*)    HDL 40 (*)    LDL Cholesterol 106 (*)    All other  components within normal limits  CBC WITH DIFFERENTIAL/PLATELET  BRAIN NATRIURETIC PEPTIDE  TSH  HEMOGLOBIN A1C  TROPONIN I (HIGH SENSITIVITY)  TROPONIN I (HIGH SENSITIVITY)    EKG EKG Interpretation  Date/Time:  Wednesday May 13 2023 11:22:14 EDT Ventricular Rate:  68 PR Interval:  132 QRS Duration: 81 QT Interval:  375 QTC Calculation: 399 R Axis:   67 Text Interpretation: Sinus rhythm Atrial premature complex Abnormal T, consider ischemia, lateral leads No old tracing to compare Confirmed by Pricilla Loveless [phone]) on 05/13/2023 11:32:58 AM  Radiology DG Chest 2 View  Result Date: 05/13/2023 CLINICAL DATA:  chest pain EXAM: CHEST - 2 VIEW COMPARISON:  CXR 09/18/17 FINDINGS: No pleural effusion. No pneumothorax. No focal airspace opacity. There are prominent bilateral interstitial opacities that could represent pulmonary venous congestion or atypical infection. Normal cardiac and mediastinal contours. No radiographically apparent displaced rib fractures. Visualized upper abdomen is unremarkable. Vertebral body heights are maintained. IMPRESSION: Prominent bilateral interstitial opacities could represent pulmonary venous congestion or atypical infection. Electronically Signed   By: Lorenza Cambridge M.D.   On: 05/13/2023 11:37    Procedures Procedures    Medications Ordered in ED Medications  pantoprazole (PROTONIX) EC tablet 40 mg (has no administration in time range)  aspirin EC tablet 81 mg (81 mg Oral Not Given 05/13/23 1356)  magnesium oxide (MAG-OX) tablet 400 mg (has no administration in time range)  cyanocobalamin (VITAMIN B12) tablet 1,000 mcg (has no administration in time range)  multivitamin with minerals tablet 1 tablet (has no administration in time range)  methocarbamol (ROBAXIN) tablet 500 mg (has no administration in time range)  morphine (PF) 2 MG/ML injection 2 mg (has no administration in time range)  HYDROcodone-acetaminophen (NORCO/VICODIN) 5-325 MG per tablet  1 tablet (has no administration in time range)  enoxaparin (LOVENOX) injection 40 mg (has no administration in time range)  sodium chloride flush (NS) 0.9 % injection 3 mL (3 mLs Intravenous Given 05/13/23 1434)  sodium chloride flush (NS) 0.9 % injection 3 mL (has no administration in time range)  0.9 %  sodium chloride infusion (has no administration in time range)  acetaminophen (TYLENOL) tablet 650 mg (has no administration in time range)    Or  acetaminophen (TYLENOL) suppository 650 mg (has no administration in time range)  ondansetron (ZOFRAN) tablet 4 mg (has no administration in time range)    Or  ondansetron (ZOFRAN) injection 4 mg (has no administration in time range)  aspirin chewable tablet 324 mg (324 mg Oral Given 05/13/23 1117)  ED Course/ Medical Decision Making/ A&P                             Medical Decision Making Amount and/or Complexity of Data Reviewed External Data Reviewed: notes. Labs: ordered.    Details: Normal troponin. Radiology: ordered and independent interpretation performed.    Details: No lobar pneumonia ECG/medicine tests: ordered and independent interpretation performed.    Details: Nonspecific T waves  Risk OTC drugs. Decision regarding hospitalization.   Patient's chest pain is atypical.  I discussed with the cardiologist who sent him here, Dr. Jenene Slicker, who feels he should be admitted for inpatient cardiology testing. Currently pain free. Discussed with Dr. Diona Browner who will consult. Dr. Gwenlyn Perking will admit.   Low suspicion for PE or dissection. Will hold off on heparin for now.        Final Clinical Impression(s) / ED Diagnoses Final diagnoses:  Atypical chest pain    Rx / DC Orders ED Discharge Orders     None         Pricilla Loveless, MD 05/13/23 1511

## 2023-05-13 NOTE — Assessment & Plan Note (Signed)
-  Body mass index is 30.73 kg/m. -Low-calorie diet, portion control and increase physical activity discussed with patient.

## 2023-05-13 NOTE — ED Notes (Signed)
Pt uses a urinal

## 2023-05-13 NOTE — ED Notes (Signed)
Patient transported to MRI 

## 2023-05-14 ENCOUNTER — Other Ambulatory Visit: Payer: Self-pay | Admitting: *Deleted

## 2023-05-14 ENCOUNTER — Telehealth: Payer: Self-pay | Admitting: *Deleted

## 2023-05-14 ENCOUNTER — Encounter: Payer: Self-pay | Admitting: *Deleted

## 2023-05-14 ENCOUNTER — Other Ambulatory Visit (HOSPITAL_COMMUNITY): Payer: Self-pay | Admitting: *Deleted

## 2023-05-14 ENCOUNTER — Observation Stay (HOSPITAL_BASED_OUTPATIENT_CLINIC_OR_DEPARTMENT_OTHER): Payer: Medicare HMO

## 2023-05-14 DIAGNOSIS — R079 Chest pain, unspecified: Secondary | ICD-10-CM | POA: Diagnosis not present

## 2023-05-14 DIAGNOSIS — E785 Hyperlipidemia, unspecified: Secondary | ICD-10-CM | POA: Diagnosis not present

## 2023-05-14 DIAGNOSIS — K219 Gastro-esophageal reflux disease without esophagitis: Secondary | ICD-10-CM | POA: Diagnosis not present

## 2023-05-14 DIAGNOSIS — R9431 Abnormal electrocardiogram [ECG] [EKG]: Secondary | ICD-10-CM | POA: Diagnosis not present

## 2023-05-14 DIAGNOSIS — R0789 Other chest pain: Secondary | ICD-10-CM | POA: Diagnosis not present

## 2023-05-14 DIAGNOSIS — I10 Essential (primary) hypertension: Secondary | ICD-10-CM | POA: Diagnosis not present

## 2023-05-14 DIAGNOSIS — Z6830 Body mass index (BMI) 30.0-30.9, adult: Secondary | ICD-10-CM | POA: Diagnosis not present

## 2023-05-14 DIAGNOSIS — R7303 Prediabetes: Secondary | ICD-10-CM | POA: Diagnosis not present

## 2023-05-14 DIAGNOSIS — E6609 Other obesity due to excess calories: Secondary | ICD-10-CM | POA: Diagnosis not present

## 2023-05-14 DIAGNOSIS — R931 Abnormal findings on diagnostic imaging of heart and coronary circulation: Secondary | ICD-10-CM | POA: Diagnosis not present

## 2023-05-14 LAB — COMPREHENSIVE METABOLIC PANEL
ALT: 22 U/L (ref 0–44)
AST: 21 U/L (ref 15–41)
Albumin: 3.8 g/dL (ref 3.5–5.0)
Alkaline Phosphatase: 55 U/L (ref 38–126)
Anion gap: 9 (ref 5–15)
BUN: 15 mg/dL (ref 8–23)
CO2: 27 mmol/L (ref 22–32)
Calcium: 9.2 mg/dL (ref 8.9–10.3)
Chloride: 99 mmol/L (ref 98–111)
Creatinine, Ser: 0.9 mg/dL (ref 0.61–1.24)
GFR, Estimated: >60 mL/min (ref >60)
Glucose, Bld: 113 mg/dL — ABNORMAL HIGH (ref 70–99)
Potassium: 4 mmol/L (ref 3.5–5.1)
Sodium: 135 mmol/L (ref 135–145)
Total Bilirubin: 0.8 mg/dL (ref 0.3–1.2)
Total Protein: 6.9 g/dL (ref 6.5–8.1)

## 2023-05-14 LAB — TROPONIN I (HIGH SENSITIVITY): Troponin I (High Sensitivity): 4 ng/L (ref <18)

## 2023-05-14 LAB — CBC
HCT: 48.7 % (ref 39.0–52.0)
Hemoglobin: 15.9 g/dL (ref 13.0–17.0)
MCH: 30.2 pg (ref 26.0–34.0)
MCHC: 32.6 g/dL (ref 30.0–36.0)
MCV: 92.4 fL (ref 80.0–100.0)
Platelets: 193 10*3/uL (ref 150–400)
RBC: 5.27 MIL/uL (ref 4.22–5.81)
RDW: 12.8 % (ref 11.5–15.5)
WBC: 5.8 10*3/uL (ref 4.0–10.5)
nRBC: 0 % (ref 0.0–0.2)

## 2023-05-14 LAB — ECHOCARDIOGRAM COMPLETE
Area-P 1/2: 3.1 cm2
Height: 66 in
S' Lateral: 2.2 cm
Weight: 3046.4 oz

## 2023-05-14 LAB — GLUCOSE, CAPILLARY: Glucose-Capillary: 95 mg/dL (ref 70–99)

## 2023-05-14 MED ORDER — METOPROLOL TARTRATE 25 MG PO TABS: 25.0000 mg | ORAL_TABLET | ORAL | 0 refills | Status: AC

## 2023-05-14 MED ORDER — METHOCARBAMOL 500 MG PO TABS: 500.0000 mg | ORAL_TABLET | Freq: Three times a day (TID) | ORAL | 0 refills | Status: AC | PRN

## 2023-05-14 MED ORDER — PERFLUTREN LIPID MICROSPHERE
1.0000 mL | INTRAVENOUS | Status: AC | PRN
  Administered 2023-05-14: 4 mL via INTRAVENOUS

## 2023-05-14 MED ORDER — OMEGA-3-ACID ETHYL ESTERS 1 G PO CAPS: 1.0000 g | ORAL_CAPSULE | Freq: Two times a day (BID) | ORAL | 2 refills | Status: DC

## 2023-05-14 MED ORDER — PANTOPRAZOLE SODIUM 40 MG PO TBEC: 40.0000 mg | DELAYED_RELEASE_TABLET | Freq: Two times a day (BID) | ORAL | 1 refills | Status: AC

## 2023-05-14 NOTE — Discharge Summary (Signed)
Physician Discharge Summary   Patient: Stanley Bass MRN: [id number] DOB: [birth year]  Admit date:     05/13/2023  Discharge date: 05/14/23  Discharge Physician: Vassie Loll   PCP: Mirna Mires, MD   Recommendations at discharge:  Repeat basic metabolic panel to follow electrolytes and renal function Reassess blood pressure and start antihypertensive regimen if required Continue close monitoring of patient's lipid panel and adjust medication as needed. Continue intermittent follow-up outpatient CBGs/A1c to continue monitoring prediabetes condition. Continue assisting patient with weight loss management.  Discharge Diagnoses: Principal Problem:   Chest pain Active Problems:   GERD (gastroesophageal reflux disease)   Class 1 obesity due to excess calories with body mass index (BMI) of 30.0 to 30.9 in adult   Hyperlipidemia   Prediabetes  Brief Hospital admission narrative: Stanley Bass is a 77 y.o. male with medical history significant of prediabetes, prior history of prostate cancer (s/p surgical resection and currently in remission), class I obesity, hyperlipidemia and family history of coronary artery disease; who presented to the hospital secondary to mid chest/upper epigastric area discomfort.  Patient has been present for the last 2 to 3 days without any specific relief or association with activity.  Patient was seen by his cardiologist on the day of admission who given his history and heart score of 4 transferred to the hospital for further evaluation and management. Patient reports no shortness of breath, no nausea, no vomiting, no dysuria, no hematuria, no melena, no hematochezia or focal deficits.   Negative troponin and chest x-ray demonstrating possible vascular congestion versus atypical infection.  EKG with deep T wave inversion (larger than previous EKG tracings).  Cardiology has been consulted and TR[facility] contacted to place in the hospital for further evaluation and  management.  Assessment and Plan: * Chest pain -Atypical presentation in a patient with heart score of 4. -Telemetry without acute ischemic changes, negative troponin and reassuring 2D echo. -Cardiology service recommending continue the use of aspirin and statin with outpatient follow-up for further evaluation/management. -Given concern for ongoing GERD PPI twice a day initiated. -At discharge patient was chest pain-free and in no acute distress. -A1c 5.9, normal TSH and lipid panel demonstrating dyslipidemia with HDL 40 and triglycerides in the 180 range; LDL was 106.  Prediabetes -A1c 5.9 -modified carb diet discussed with patient -Continue to follow CBGs fluctuation as an outpatient. -Patient instructed to maintain adequate hydration.  Hyperlipidemia -Dyslipidemia appreciated. -Resumption of daily statin as recommended by cardiology service and also Lovaza recommended -Heart healthy diet instructed.  Class 1 obesity due to excess calories with body mass index (BMI) of 30.0 to 30.9 in adult -Body mass index is 30.73 kg/m. -Low-calorie diet, portion control and increase physical activity discussed with patient.  GERD (gastroesophageal reflux disease) -Patient has been started on PPI -Lifestyle modification discussed with patient.   Consultants: Cardiology service Procedures performed: See below for x-ray report/2D echo. Disposition: Home Diet recommendation: Heart healthy/modified carbohydrate diet.  DISCHARGE MEDICATION: Allergies as of 05/14/2023       Reactions   Shellfish-derived Products Hives   Penicillins Rash   Has patient had a PCN reaction causing immediate rash, facial/tongue/throat swelling, SOB or lightheadedness with hypotension: Unknown Has patient had a PCN reaction causing severe rash involving mucus membranes or skin necrosis:Unknown Has patient had a PCN reaction that required hospitalization: No Has patient had a PCN reaction occurring within the last  10 years: No If all of the above answers are "NO", then may proceed with  Cephalosporin use.        Medication List     STOP taking these medications    cyclobenzaprine 10 MG tablet Commonly known as: FLEXERIL   Iron 325 (65 Fe) MG Tabs   predniSONE 5 MG (21) Tbpk tablet Commonly known as: STERAPRED UNI-PAK 21 TAB       TAKE these medications    aspirin EC 81 MG tablet Take 81 mg by mouth daily.   atorvastatin 20 MG tablet Commonly known as: LIPITOR Take 1 tablet (20 mg total) by mouth at bedtime.   cyanocobalamin 1000 MCG tablet Commonly known as: VITAMIN B12 Take 1,000 mcg by mouth every other day.   HYDROcodone-acetaminophen 5-325 MG tablet Commonly known as: NORCO/VICODIN Take 1 tablet by mouth 2 (two) times daily as needed.   Magnesium 500 MG Tabs Take 500 mg by mouth daily.   methocarbamol 500 MG tablet Commonly known as: ROBAXIN Take 1 tablet (500 mg total) by mouth every 8 (eight) hours as needed for muscle spasms.   multivitamin with minerals Tabs tablet Take 1 tablet by mouth daily. Men's 50+   omega-3 acid ethyl esters 1 g capsule Commonly known as: Lovaza Take 1 capsule (1 g total) by mouth 2 (two) times daily.   pantoprazole 40 MG tablet Commonly known as: PROTONIX Take 1 tablet (40 mg total) by mouth 2 (two) times daily. Take twice a day for 3 weeks; then 1 tablet by mouth daily after that.        Follow-up Information     Mallipeddi, Orion Modest, MD Follow up on 06/05/2023.   Specialties: Cardiology, Internal Medicine Why: Follow-up on 06/05/2023 at 10:40 AM. The office will reach out about arranging a Coronary CT Scan in the interim. Contact information: [address] [place] [address] [phone]         Mirna Mires, MD. Schedule an appointment as soon as possible for a visit in 10 day(s).   Specialty: Family Medicine Contact information: [address] [place] [address] [phone]                 Discharge Exam: Filed Weights   05/13/23 1036  Weight: 86.4 kg   General exam: Alert, awake, oriented x 3 Respiratory system: Clear to auscultation. Respiratory effort normal. Cardiovascular system:RRR. No murmurs, rubs, gallops. Gastrointestinal system: Abdomen is nondistended, soft and nontender. No organomegaly or masses felt. Normal bowel sounds heard. Central nervous system: Alert and oriented. No focal neurological deficits. Extremities: No C/C/E, +pedal pulses Skin: No rashes, lesions or ulcers Psychiatry: Judgement and insight appear normal. Mood & affect appropriate.    Condition at discharge: Stable and improved.  The results of significant diagnostics from this hospitalization (including imaging, microbiology, ancillary and laboratory) are listed below for reference.   Imaging Studies: ECHOCARDIOGRAM COMPLETE  Result Date: 05/14/2023    ECHOCARDIOGRAM REPORT   Patient Name:   LIANG STOLLE Date of Exam: 05/14/2023 Medical Rec #:  [id number]       Height:       66.0 in Accession #:    [id number]      Weight:       190.4 lb Date of Birth:  03/25/1946       BSA:          1.958 m Patient Age:    76 years        BP:           138/69 mmHg Patient Gender: M  HR:           51 bpm. [facility] Procedure: 2D Echo, Cardiac Doppler and Color Doppler Indications:    Chest Pain R07.9  History:        Patient has prior history of Echocardiogram examinations, most                 recent 01/01/2009. Risk Factors:Hypertension and Prediabetes.  Sonographer:    Celesta Gentile RCS Referring Phys: [id number] Ellsworth Lennox  Sonographer Comments: Patient c/o some back pain and headache after Definity. Nurse informed. IMPRESSIONS  1. Left ventricular ejection fraction, by estimation, is 65 to 70%. The left ventricle has normal function. The left ventricle has no regional wall motion abnormalities. There is mild left ventricular hypertrophy. Left ventricular diastolic parameters  are consistent with Grade I diastolic dysfunction (impaired relaxation).  2. Right ventricular systolic function is normal. The right ventricular size is normal. Tricuspid regurgitation signal is inadequate for assessing PA pressure.  3. The mitral valve is grossly normal. Trivial mitral valve regurgitation.  4. The aortic valve is tricuspid. Aortic valve regurgitation is not visualized.  5. The inferior vena cava is normal in size with greater than 50% respiratory variability, suggesting right atrial pressure of 3 mmHg. Comparison(s): Prior images unable to be directly viewed. FINDINGS  Left Ventricle: Left ventricular ejection fraction, by estimation, is 65 to 70%. The left ventricle has normal function. The left ventricle has no regional wall motion abnormalities. The left ventricular internal cavity size was normal in size. There is  mild left ventricular hypertrophy. Left ventricular diastolic parameters are consistent with Grade I diastolic dysfunction (impaired relaxation). Right Ventricle: The right ventricular size is normal. No increase in right ventricular wall thickness. Right ventricular systolic function is normal. Tricuspid regurgitation signal is inadequate for assessing PA pressure. Left Atrium: Left atrial size was normal in size. Right Atrium: Right atrial size was normal in size. Pericardium: There is no evidence of pericardial effusion. Mitral Valve: The mitral valve is grossly normal. Mild mitral annular calcification. Trivial mitral valve regurgitation. Tricuspid Valve: The tricuspid valve is grossly normal. Tricuspid valve regurgitation is trivial. Aortic Valve: The aortic valve is tricuspid. There is mild to moderate aortic valve annular calcification. Aortic valve regurgitation is not visualized. Pulmonic Valve: The pulmonic valve was grossly normal. Pulmonic valve regurgitation is trivial. Aorta: The aortic root is normal in size and structure. Venous: The inferior vena cava is normal in  size with greater than 50% respiratory variability, suggesting right atrial pressure of 3 mmHg. IAS/Shunts: No atrial level shunt detected by color flow Doppler.  LEFT VENTRICLE PLAX 2D LVIDd:         3.90 cm   Diastology LVIDs:         2.20 cm   LV e' medial:    4.90 cm/s LV PW:         1.00 cm   LV E/e' medial:  13.7 LV IVS:        1.20 cm   LV e' lateral:   7.51 cm/s LVOT diam:     1.80 cm   LV E/e' lateral: 9.0 LV SV:         53 LV SV Index:   27 LVOT Area:     2.54 cm  RIGHT VENTRICLE RV S prime:     13.70 cm/s TAPSE (M-mode): 2.6 cm LEFT ATRIUM             Index  RIGHT ATRIUM           Index LA diam:        3.20 cm 1.63 cm/m   RA Area:     19.10 cm LA Vol (A2C):   49.8 ml 25.43 ml/m  RA Volume:   55.10 ml  28.13 ml/m LA Vol (A4C):   48.0 ml 24.51 ml/m LA Biplane Vol: 49.2 ml 25.12 ml/m  AORTIC VALVE LVOT Vmax:   90.10 cm/s LVOT Vmean:  63.100 cm/s LVOT VTI:    0.210 m  AORTA Ao Root diam: 3.00 cm MITRAL VALVE MV Area (PHT): 3.10 cm    SHUNTS MV Decel Time: 245 msec    Systemic VTI:  0.21 m MV E velocity: 67.35 cm/s  Systemic Diam: 1.80 cm MV A velocity: 77.30 cm/s MV E/A ratio:  0.87 Nona Dell MD Electronically signed by Nona Dell MD Signature Date/Time: 05/14/2023/10:42:49 AM    Final    DG Chest 2 View  Result Date: 05/13/2023 CLINICAL DATA:  chest pain EXAM: CHEST - 2 VIEW COMPARISON:  CXR 09/18/17 FINDINGS: No pleural effusion. No pneumothorax. No focal airspace opacity. There are prominent bilateral interstitial opacities that could represent pulmonary venous congestion or atypical infection. Normal cardiac and mediastinal contours. No radiographically apparent displaced rib fractures. Visualized upper abdomen is unremarkable. Vertebral body heights are maintained. IMPRESSION: Prominent bilateral interstitial opacities could represent pulmonary venous congestion or atypical infection. Electronically Signed   By: Lorenza Cambridge M.D.   On: 05/13/2023 11:37    Microbiology: No  results found for this or any previous visit.  Labs: CBC: Recent Labs  Lab 05/13/23 1140 05/14/23 0525  WBC 4.3 5.8  NEUTROABS 2.4  --   HGB 14.5 15.9  HCT 43.3 48.7  MCV 90.4 92.4  PLT 191 193   Basic Metabolic Panel: Recent Labs  Lab 05/13/23 1140 05/14/23 0614  NA 134* 135  K 3.8 4.0  CL 103 99  CO2 25 27  GLUCOSE 133* 113*  BUN 15 15  CREATININE 0.92 0.90  CALCIUM 8.9 9.2   Liver Function Tests: Recent Labs  Lab 05/14/23 0614  AST 21  ALT 22  ALKPHOS 55  BILITOT 0.8  PROT 6.9  ALBUMIN 3.8   CBG: Recent Labs  Lab 05/14/23 0734  GLUCAP 95    Discharge time spent: less than 30 minutes.  Signed: Vassie Loll, MD [facility] 05/14/2023

## 2023-05-14 NOTE — Progress Notes (Signed)
*  PRELIMINARY RESULTS* Echocardiogram 2D Echocardiogram has been performed with Definity. Patient did c/o some headache and lower back pain. Nurse informed of this.  Stacey Drain 05/14/2023, 9:38 AM

## 2023-05-14 NOTE — Telephone Encounter (Signed)
-----   Message from Ellsworth Lennox, [place] sent at 05/14/2023 11:00 AM EDT ----- Regarding: Outpatient Coronary CTA This patient needs an outpatient Coronary CTA for chest pain per Dr. Diona Browner. Established patient of Dr. Jenene Slicker and he wanted it to be ordered under her. Heart rate was 51 bpm this AM.   Thanks for your help!  Best,  [place]

## 2023-05-14 NOTE — Progress Notes (Signed)
Transition of Care [facility]) - Inpatient Brief Assessment   Patient Details  Name: Stanley Bass MRN: [id number] Date of Birth: [birth year]  Transition [facility] CM/SW Contact:    Annice Needy, LCSW Phone Number: 05/14/2023, 1:38 PM   Clinical Narrative: Transition of Care Department [facility]) has reviewed patient and no TOC needs have been identified at this time. We will continue to monitor patient advancement through interdisciplinary progression rounds. If new patient transition needs arise, please place a TOC consult.   Transition of Care Asessment: Insurance and Status: Insurance coverage has been reviewed Patient has primary care physician: Yes Home environment has been reviewed: home with spouse Prior level of function:: independent Prior/Current Home Services: No current home services Social Determinants of Health Reivew: SDOH reviewed no interventions necessary Readmission risk has been reviewed: Yes Transition of care needs: no transition of care needs at this time

## 2023-05-14 NOTE — Telephone Encounter (Signed)
Pt notified of instructions for CT Scan and that someone would call for an appt.

## 2023-05-14 NOTE — Progress Notes (Signed)
Progress Note  Patient Name: Stanley Bass Date of Encounter: 05/14/2023  Primary Cardiologist: Marjo Bicker, MD  Interval Summary   No chest pain or shortness of breath this morning.  No palpitations.  Vital Signs    Vitals:   05/13/23 1605 05/13/23 1612 05/13/23 1625 05/14/23 0537  BP:  123/73 (!) 161/69 138/69  Pulse: 62 60 61 (!) 51  Resp: 17 18 16    Temp:  98.2 F (36.8 C)  98.2 F (36.8 C)  TempSrc:  Oral Oral Oral  SpO2: 96% 100% 100% 100%  Weight:      Height:        Intake/Output Summary (Last 24 hours) at 05/14/2023 1049 Last data filed at 05/14/2023 0700 Gross per 24 hour  Intake 240 ml  Output --  Net 240 ml   Filed Weights   05/13/23 1036  Weight: 86.4 kg    Physical Exam   GEN: No acute distress.   Neck: No JVD. Cardiac: RRR, 1/6 systolic murmur, no gallop. Respiratory: Nonlabored. Clear to auscultation bilaterally. GI: Soft, bowel sounds present. MS: No edema.  ECG/Telemetry    Telemetry reviewed showing sinus rhythm.  Labs    Chemistry Recent Labs  Lab 05/13/23 1140 05/14/23 0614  NA 134* 135  K 3.8 4.0  CL 103 99  CO2 25 27  GLUCOSE 133* 113*  BUN 15 15  CREATININE 0.92 0.90  CALCIUM 8.9 9.2  PROT  --  6.9  ALBUMIN  --  3.8  AST  --  21  ALT  --  22  ALKPHOS  --  55  BILITOT  --  0.8  GFRNONAA >60 >60  ANIONGAP 6 9    Hematology Recent Labs  Lab 05/13/23 1140 05/14/23 0525  WBC 4.3 5.8  RBC 4.79 5.27  HGB 14.5 15.9  HCT 43.3 48.7  MCV 90.4 92.4  MCH 30.3 30.2  MCHC 33.5 32.6  RDW 13.1 12.8  PLT 191 193   Cardiac Enzymes Recent Labs  Lab 05/13/23 1140 05/13/23 1348 05/14/23 0525  TROPONINIHS 4 4 4    Lipid Panel     Component Value Date/Time   CHOL 183 05/13/2023 1348   TRIG 185 (H) 05/13/2023 1348   HDL 40 (L) 05/13/2023 1348   CHOLHDL 4.6 05/13/2023 1348   VLDL 37 05/13/2023 1348   LDLCALC 106 (H) 05/13/2023 1348    Cardiac Studies   Echocardiogram 05/14/2023:  1. Left  ventricular ejection fraction, by estimation, is 65 to 70%. The  left ventricle has normal function. The left ventricle has no regional  wall motion abnormalities. There is mild left ventricular hypertrophy.  Left ventricular diastolic parameters  are consistent with Grade I diastolic dysfunction (impaired relaxation).   2. Right ventricular systolic function is normal. The right ventricular  size is normal. Tricuspid regurgitation signal is inadequate for assessing  PA pressure.   3. The mitral valve is grossly normal. Trivial mitral valve  regurgitation.   4. The aortic valve is tricuspid. Aortic valve regurgitation is not  visualized.   5. The inferior vena cava is normal in size with greater than 50%  respiratory variability, suggesting right atrial pressure of 3 mmHg.   Assessment & Plan   1.  Atypical chest pain.  Possibly GI in etiology.  He does have known coronary artery calcification with prior coronary calcium score of 245 in 2021 (LAD predominant).  Myocardial perfusion imaging was normal at that time and he has been managed medically.  No evidence of ACS by enzymes.  ECG with nonspecific ST segment changes and borderline inferior Q waves.  Follow-up echocardiogram shows normal LVEF at 65 to 70% and no regional wall motion abnormalities.  2.  Essential hypertension.  3.  Mixed hyperlipidemia.  Plan discharge home today.  Continue aspirin and Lipitor.  With recent recurring chest discomfort we will set up an outpatient cardiac CTA to better define coronary anatomy and ensure that continued medical therapy is the most optimal strategy.  He will follow-up with Dr. Jenene Slicker as an outpatient.  For questions or updates, please contact [facility] Please consult [email or web address] for contact info under   Signed, Nona Dell, MD  05/14/2023, 10:49 AM

## 2023-05-21 ENCOUNTER — Ambulatory Visit: Payer: Medicare HMO | Admitting: Orthopedic Surgery

## 2023-05-22 ENCOUNTER — Other Ambulatory Visit: Payer: Self-pay | Admitting: Nurse Practitioner

## 2023-05-22 MED ORDER — OMEGA-3 FATTY ACIDS 1000 MG PO CAPS: 2.0000 g | ORAL_CAPSULE | Freq: Every day | ORAL | 1 refills | Status: AC

## 2023-05-22 NOTE — Progress Notes (Signed)
Insurance did not cover Lov[employer]. Called in [employer] Omega-3 FAs instead

## 2023-05-25 ENCOUNTER — Telehealth (HOSPITAL_COMMUNITY): Payer: Self-pay | Admitting: Emergency Medicine

## 2023-05-25 ENCOUNTER — Ambulatory Visit (HOSPITAL_COMMUNITY): Payer: MEDICARE

## 2023-05-25 NOTE — Telephone Encounter (Signed)
Attempted to call patient regarding upcoming cardiac CT appointment. °Left message on voicemail with name and callback number °Tayona Sarnowski RN Navigator Cardiac Imaging °[facility] °[phone] Office °[phone] Cell ° °

## 2023-05-26 ENCOUNTER — Ambulatory Visit (HOSPITAL_COMMUNITY): Payer: Medicare HMO

## 2023-06-01 ENCOUNTER — Ambulatory Visit: Payer: Medicare HMO | Admitting: Orthopedic Surgery

## 2023-06-02 DIAGNOSIS — E118 Type 2 diabetes mellitus with unspecified complications: Secondary | ICD-10-CM | POA: Diagnosis not present

## 2023-06-02 DIAGNOSIS — M5417 Radiculopathy, lumbosacral region: Secondary | ICD-10-CM | POA: Diagnosis not present

## 2023-06-04 ENCOUNTER — Other Ambulatory Visit (INDEPENDENT_AMBULATORY_CARE_PROVIDER_SITE_OTHER): Payer: Medicare HMO

## 2023-06-04 ENCOUNTER — Ambulatory Visit (INDEPENDENT_AMBULATORY_CARE_PROVIDER_SITE_OTHER): Payer: Medicare HMO | Admitting: Orthopedic Surgery

## 2023-06-04 ENCOUNTER — Encounter: Payer: Self-pay | Admitting: Orthopedic Surgery

## 2023-06-04 VITALS — BP 155/81 | HR 80 | Ht 67.0 in | Wt 190.0 lb

## 2023-06-04 DIAGNOSIS — M545 Low back pain, unspecified: Secondary | ICD-10-CM

## 2023-06-04 DIAGNOSIS — M48061 Spinal stenosis, lumbar region without neurogenic claudication: Secondary | ICD-10-CM

## 2023-06-04 DIAGNOSIS — M25551 Pain in right hip: Secondary | ICD-10-CM

## 2023-06-04 MED ORDER — TIZANIDINE HCL 4 MG PO TABS
4.0000 mg | ORAL_TABLET | Freq: Every day | ORAL | 0 refills | Status: AC
Start: 2023-06-04 — End: ?

## 2023-06-04 MED ORDER — PREDNISONE 10 MG (48) PO TBPK
ORAL_TABLET | Freq: Every day | ORAL | 0 refills | Status: AC
Start: 2023-06-04 — End: ?
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
## 2023-06-04 NOTE — Patient Instructions (Addendum)
FOR PAIN TAKE TYLENOL 500 MG EVERY 6 HRS AS NEEDED   AND   Meds ordered this encounter  Medications   predniSONE (STERAPRED UNI-PAK 48 TAB) 10 MG (48) TBPK tablet    Sig: Take by mouth daily. 10 mg ds 12 days    Dispense:  48 tablet    Refill:  0   tiZANidine (ZANAFLEX) 4 MG tablet    Sig: Take 1 tablet (4 mg total) by mouth at bedtime.    Dispense:  30 tablet    Refill:  0

## 2023-06-05 ENCOUNTER — Ambulatory Visit: Payer: Medicare HMO | Admitting: Internal Medicine

## 2023-06-23 ENCOUNTER — Ambulatory Visit: Payer: Medicare HMO | Admitting: Internal Medicine

## 2023-06-30 ENCOUNTER — Other Ambulatory Visit (HOSPITAL_COMMUNITY): Payer: Self-pay | Admitting: Family Medicine

## 2023-06-30 ENCOUNTER — Encounter (HOSPITAL_COMMUNITY): Payer: Self-pay | Admitting: Family Medicine

## 2023-06-30 DIAGNOSIS — R29810 Facial weakness: Secondary | ICD-10-CM

## 2023-06-30 DIAGNOSIS — M545 Low back pain, unspecified: Secondary | ICD-10-CM | POA: Diagnosis not present

## 2023-06-30 DIAGNOSIS — M5417 Radiculopathy, lumbosacral region: Secondary | ICD-10-CM | POA: Diagnosis not present

## 2023-07-14 ENCOUNTER — Encounter (HOSPITAL_COMMUNITY): Payer: Self-pay

## 2023-07-14 ENCOUNTER — Ambulatory Visit (HOSPITAL_COMMUNITY): Payer: Medicare HMO

## 2023-08-17 ENCOUNTER — Encounter (HOSPITAL_COMMUNITY): Payer: Self-pay

## 2023-08-17 ENCOUNTER — Ambulatory Visit (HOSPITAL_COMMUNITY): Payer: Medicare HMO

## 2023-09-26 DIAGNOSIS — R69 Illness, unspecified: Secondary | ICD-10-CM | POA: Diagnosis not present

## 2024-01-04 DIAGNOSIS — M5417 Radiculopathy, lumbosacral region: Secondary | ICD-10-CM | POA: Diagnosis not present

## 2024-01-04 DIAGNOSIS — R7303 Prediabetes: Secondary | ICD-10-CM | POA: Diagnosis not present

## 2024-01-04 DIAGNOSIS — M545 Low back pain, unspecified: Secondary | ICD-10-CM | POA: Diagnosis not present

## 2024-01-04 DIAGNOSIS — K429 Umbilical hernia without obstruction or gangrene: Secondary | ICD-10-CM | POA: Diagnosis not present

## 2024-01-04 DIAGNOSIS — E7800 Pure hypercholesterolemia, unspecified: Secondary | ICD-10-CM | POA: Diagnosis not present

## 2024-05-03 DIAGNOSIS — R7303 Prediabetes: Secondary | ICD-10-CM | POA: Diagnosis not present

## 2024-05-03 DIAGNOSIS — S0081XA Abrasion of other part of head, initial encounter: Secondary | ICD-10-CM | POA: Diagnosis not present

## 2024-05-03 DIAGNOSIS — E7800 Pure hypercholesterolemia, unspecified: Secondary | ICD-10-CM | POA: Diagnosis not present

## 2024-05-03 DIAGNOSIS — K429 Umbilical hernia without obstruction or gangrene: Secondary | ICD-10-CM | POA: Diagnosis not present

## 2024-05-03 DIAGNOSIS — S80211A Abrasion, right knee, initial encounter: Secondary | ICD-10-CM | POA: Diagnosis not present

## 2024-07-04 DIAGNOSIS — R0789 Other chest pain: Secondary | ICD-10-CM | POA: Diagnosis not present

## 2024-07-04 DIAGNOSIS — R002 Palpitations: Secondary | ICD-10-CM | POA: Diagnosis not present

## 2024-07-04 DIAGNOSIS — R42 Dizziness and giddiness: Secondary | ICD-10-CM | POA: Diagnosis not present

## 2024-07-04 DIAGNOSIS — R0602 Shortness of breath: Secondary | ICD-10-CM | POA: Diagnosis not present

## 2024-07-04 DIAGNOSIS — I491 Atrial premature depolarization: Secondary | ICD-10-CM | POA: Diagnosis not present

## 2024-07-04 DIAGNOSIS — R9431 Abnormal electrocardiogram [ECG] [EKG]: Secondary | ICD-10-CM | POA: Diagnosis not present

## 2024-07-11 DIAGNOSIS — R42 Dizziness and giddiness: Secondary | ICD-10-CM | POA: Diagnosis not present

## 2024-07-15 ENCOUNTER — Encounter: Payer: Self-pay | Admitting: Radiology

## 2024-07-26 DIAGNOSIS — R0602 Shortness of breath: Secondary | ICD-10-CM | POA: Diagnosis not present

## 2024-09-01 DIAGNOSIS — R002 Palpitations: Secondary | ICD-10-CM | POA: Diagnosis not present

## 2024-09-01 DIAGNOSIS — R0609 Other forms of dyspnea: Secondary | ICD-10-CM | POA: Diagnosis not present

## 2024-09-01 DIAGNOSIS — R9431 Abnormal electrocardiogram [ECG] [EKG]: Secondary | ICD-10-CM | POA: Diagnosis not present

## 2024-09-01 DIAGNOSIS — E782 Mixed hyperlipidemia: Secondary | ICD-10-CM | POA: Diagnosis not present

## 2024-09-01 DIAGNOSIS — Z7689 Persons encountering health services in other specified circumstances: Secondary | ICD-10-CM | POA: Diagnosis not present

## 2024-09-06 DIAGNOSIS — R0602 Shortness of breath: Secondary | ICD-10-CM | POA: Diagnosis not present

## 2024-09-13 DIAGNOSIS — R9439 Abnormal result of other cardiovascular function study: Secondary | ICD-10-CM | POA: Diagnosis not present

## 2024-09-13 DIAGNOSIS — E782 Mixed hyperlipidemia: Secondary | ICD-10-CM | POA: Diagnosis not present

## 2024-09-22 ENCOUNTER — Encounter: Payer: Self-pay | Admitting: *Deleted

## 2024-09-22 DIAGNOSIS — R0609 Other forms of dyspnea: Secondary | ICD-10-CM | POA: Diagnosis not present

## 2024-09-22 DIAGNOSIS — Z7689 Persons encountering health services in other specified circumstances: Secondary | ICD-10-CM | POA: Diagnosis not present

## 2024-09-22 DIAGNOSIS — E782 Mixed hyperlipidemia: Secondary | ICD-10-CM | POA: Diagnosis not present

## 2024-09-22 NOTE — Progress Notes (Signed)
 LATHAN GIESELMAN                                          MRN: [id number]   09/22/2024   The [facility] Quality Team Specialist reviewed this patient medical record for the purposes of chart review for care gap closure. The following were reviewed: chart review for care gap closure-controlling blood pressure and kidney health evaluation for diabetes:eGFR  and uACR.    [facility] Quality Team

## 2024-09-26 ENCOUNTER — Encounter: Payer: Self-pay | Admitting: Radiology

## 2024-12-07 NOTE — Progress Notes (Signed)
 Stanley Bass                                          MRN: [id number]   12/07/2024   The [facility] Quality Team Specialist reviewed this patient medical record for the purposes of chart review for care gap closure. The following were reviewed: abstraction for care gap closure-controlling blood pressure.    [facility] Quality Team

## 2024-12-20 NOTE — Progress Notes (Signed)
 LENG MONTESDEOCA                                          MRN: [id number]   12/20/2024   The [facility] Quality Team Specialist reviewed this patient medical record for the purposes of chart review for care gap closure. The following were reviewed: chart review for care gap closure-kidney health evaluation for diabetes:eGFR  and uACR.    [facility] Quality Team
